# Patient Record
Sex: Male | Born: 1935 | Race: White | Hispanic: No | Smoking: Former smoker
Health system: Southern US, Community
[De-identification: ages and names within clinical notes are randomized; demographics above are authoritative.]

## PROBLEM LIST (undated history)

## (undated) DIAGNOSIS — Z8619 Personal history of other infectious and parasitic diseases: Secondary | ICD-10-CM

## (undated) DIAGNOSIS — I4510 Unspecified right bundle-branch block: Secondary | ICD-10-CM

## (undated) DIAGNOSIS — I480 Paroxysmal atrial fibrillation: Secondary | ICD-10-CM

## (undated) DIAGNOSIS — Z8551 Personal history of malignant neoplasm of bladder: Secondary | ICD-10-CM

## (undated) DIAGNOSIS — R319 Hematuria, unspecified: Secondary | ICD-10-CM

## (undated) DIAGNOSIS — I2510 Atherosclerotic heart disease of native coronary artery without angina pectoris: Secondary | ICD-10-CM

## (undated) DIAGNOSIS — K759 Inflammatory liver disease, unspecified: Secondary | ICD-10-CM

## (undated) DIAGNOSIS — K5790 Diverticulosis of intestine, part unspecified, without perforation or abscess without bleeding: Secondary | ICD-10-CM

## (undated) DIAGNOSIS — Z8739 Personal history of other diseases of the musculoskeletal system and connective tissue: Secondary | ICD-10-CM

## (undated) DIAGNOSIS — R32 Unspecified urinary incontinence: Secondary | ICD-10-CM

## (undated) DIAGNOSIS — C61 Malignant neoplasm of prostate: Secondary | ICD-10-CM

## (undated) DIAGNOSIS — H04123 Dry eye syndrome of bilateral lacrimal glands: Secondary | ICD-10-CM

## (undated) DIAGNOSIS — M19019 Primary osteoarthritis, unspecified shoulder: Secondary | ICD-10-CM

## (undated) DIAGNOSIS — I498 Other specified cardiac arrhythmias: Secondary | ICD-10-CM

## (undated) DIAGNOSIS — R011 Cardiac murmur, unspecified: Secondary | ICD-10-CM

## (undated) DIAGNOSIS — Z7901 Long term (current) use of anticoagulants: Secondary | ICD-10-CM

## (undated) DIAGNOSIS — J45909 Unspecified asthma, uncomplicated: Secondary | ICD-10-CM

## (undated) DIAGNOSIS — R350 Frequency of micturition: Secondary | ICD-10-CM

## (undated) DIAGNOSIS — R351 Nocturia: Secondary | ICD-10-CM

## (undated) DIAGNOSIS — I10 Essential (primary) hypertension: Secondary | ICD-10-CM

## (undated) DIAGNOSIS — M1990 Unspecified osteoarthritis, unspecified site: Secondary | ICD-10-CM

## (undated) DIAGNOSIS — Z87438 Personal history of other diseases of male genital organs: Secondary | ICD-10-CM

## (undated) DIAGNOSIS — R634 Abnormal weight loss: Secondary | ICD-10-CM

## (undated) HISTORY — PX: TRANSURETHRAL RESECTION OF PROSTATE: SHX73

## (undated) HISTORY — PX: CARDIOVASCULAR STRESS TEST: SHX262

## (undated) HISTORY — PX: TONSILLECTOMY: SUR1361

## (undated) HISTORY — DX: Other specified cardiac arrhythmias: I49.8

## (undated) HISTORY — DX: Primary osteoarthritis, unspecified shoulder: M19.019

## (undated) HISTORY — PX: CARDIOVERSION: SHX1299

## (undated) HISTORY — PX: CATARACT EXTRACTION W/ INTRAOCULAR LENS  IMPLANT, BILATERAL: SHX1307

---

## 1963-11-19 DIAGNOSIS — Z8619 Personal history of other infectious and parasitic diseases: Secondary | ICD-10-CM

## 1963-11-19 HISTORY — DX: Personal history of other infectious and parasitic diseases: Z86.19

## 2000-06-19 ENCOUNTER — Ambulatory Visit (HOSPITAL_COMMUNITY): Admission: RE | Admit: 2000-06-19 | Discharge: 2000-06-19 | Payer: Self-pay | Admitting: Specialist

## 2000-06-19 ENCOUNTER — Encounter: Payer: Self-pay | Admitting: Specialist

## 2000-08-05 ENCOUNTER — Inpatient Hospital Stay (HOSPITAL_COMMUNITY): Admission: RE | Admit: 2000-08-05 | Discharge: 2000-08-07 | Payer: Self-pay | Admitting: *Deleted

## 2000-08-26 ENCOUNTER — Inpatient Hospital Stay (HOSPITAL_COMMUNITY): Admission: AD | Admit: 2000-08-26 | Discharge: 2000-08-29 | Payer: Self-pay | Admitting: *Deleted

## 2000-09-02 ENCOUNTER — Ambulatory Visit (HOSPITAL_COMMUNITY): Admission: RE | Admit: 2000-09-02 | Discharge: 2000-09-02 | Payer: Self-pay | Admitting: *Deleted

## 2000-09-02 ENCOUNTER — Encounter: Payer: Self-pay | Admitting: *Deleted

## 2000-12-24 ENCOUNTER — Ambulatory Visit (HOSPITAL_COMMUNITY): Admission: RE | Admit: 2000-12-24 | Discharge: 2000-12-24 | Payer: Self-pay | Admitting: Specialist

## 2001-01-13 ENCOUNTER — Ambulatory Visit: Admission: RE | Admit: 2001-01-13 | Discharge: 2001-01-13 | Payer: Self-pay | Admitting: *Deleted

## 2008-11-08 HISTORY — PX: TRANSTHORACIC ECHOCARDIOGRAM: SHX275

## 2011-09-25 ENCOUNTER — Emergency Department (HOSPITAL_COMMUNITY): Payer: Medicare Other

## 2011-09-25 ENCOUNTER — Other Ambulatory Visit: Payer: Self-pay

## 2011-09-25 ENCOUNTER — Encounter: Payer: Self-pay | Admitting: Emergency Medicine

## 2011-09-25 ENCOUNTER — Observation Stay (HOSPITAL_COMMUNITY)
Admission: EM | Admit: 2011-09-25 | Discharge: 2011-09-26 | Disposition: A | Discharge status: Home / Self Care | Payer: Medicare Other | Source: Ambulatory Visit | Attending: General Surgery | Admitting: General Surgery

## 2011-09-25 ENCOUNTER — Encounter (INDEPENDENT_AMBULATORY_CARE_PROVIDER_SITE_OTHER): Payer: Self-pay | Admitting: General Surgery

## 2011-09-25 DIAGNOSIS — S0083XA Contusion of other part of head, initial encounter: Secondary | ICD-10-CM | POA: Diagnosis present

## 2011-09-25 DIAGNOSIS — T148XXA Other injury of unspecified body region, initial encounter: Secondary | ICD-10-CM

## 2011-09-25 DIAGNOSIS — S0003XA Contusion of scalp, initial encounter: Principal | ICD-10-CM | POA: Insufficient documentation

## 2011-09-25 DIAGNOSIS — M4802 Spinal stenosis, cervical region: Secondary | ICD-10-CM | POA: Insufficient documentation

## 2011-09-25 DIAGNOSIS — IMO0002 Reserved for concepts with insufficient information to code with codable children: Secondary | ICD-10-CM | POA: Diagnosis present

## 2011-09-25 DIAGNOSIS — M109 Gout, unspecified: Secondary | ICD-10-CM | POA: Insufficient documentation

## 2011-09-25 DIAGNOSIS — S60229A Contusion of unspecified hand, initial encounter: Secondary | ICD-10-CM | POA: Insufficient documentation

## 2011-09-25 DIAGNOSIS — Z7901 Long term (current) use of anticoagulants: Secondary | ICD-10-CM | POA: Insufficient documentation

## 2011-09-25 DIAGNOSIS — I10 Essential (primary) hypertension: Secondary | ICD-10-CM | POA: Insufficient documentation

## 2011-09-25 DIAGNOSIS — S5010XA Contusion of unspecified forearm, initial encounter: Secondary | ICD-10-CM | POA: Insufficient documentation

## 2011-09-25 DIAGNOSIS — S1093XA Contusion of unspecified part of neck, initial encounter: Secondary | ICD-10-CM

## 2011-09-25 DIAGNOSIS — J40 Bronchitis, not specified as acute or chronic: Secondary | ICD-10-CM | POA: Insufficient documentation

## 2011-09-25 DIAGNOSIS — Y998 Other external cause status: Secondary | ICD-10-CM | POA: Insufficient documentation

## 2011-09-25 DIAGNOSIS — T07XXXA Unspecified multiple injuries, initial encounter: Secondary | ICD-10-CM

## 2011-09-25 DIAGNOSIS — I4891 Unspecified atrial fibrillation: Secondary | ICD-10-CM | POA: Insufficient documentation

## 2011-09-25 HISTORY — DX: Essential (primary) hypertension: I10

## 2011-09-25 HISTORY — DX: Personal history of other diseases of male genital organs: Z87.438

## 2011-09-25 LAB — PROTIME-INR
INR: 2.44 — ABNORMAL HIGH (ref 0.00–1.49)
Prothrombin Time: 26.9 seconds — ABNORMAL HIGH (ref 11.6–15.2)

## 2011-09-25 LAB — BASIC METABOLIC PANEL
BUN: 19 mg/dL (ref 6–23)
Chloride: 106 mEq/L (ref 96–112)
GFR calc Af Amer: 69 mL/min — ABNORMAL LOW (ref >90)
Glucose, Bld: 87 mg/dL (ref 70–99)
Potassium: 4 mEq/L (ref 3.5–5.1)
Sodium: 140 mEq/L (ref 135–145)

## 2011-09-25 LAB — DIFFERENTIAL
Basophils Relative: 0 % (ref 0–1)
Lymphs Abs: 2.9 10*3/uL (ref 0.7–4.0)
Monocytes Relative: 7 % (ref 3–12)
Neutro Abs: 4.1 10*3/uL (ref 1.7–7.7)
Neutrophils Relative %: 53 % (ref 43–77)

## 2011-09-25 LAB — CBC
Hemoglobin: 14.2 g/dL (ref 13.0–17.0)
RBC: 4.62 MIL/uL (ref 4.22–5.81)
WBC: 7.7 10*3/uL (ref 4.0–10.5)

## 2011-09-25 MED ORDER — POTASSIUM CHLORIDE 2 MEQ/ML IV SOLN
INTRAVENOUS | Status: DC
  Administered 2011-09-26: 02:00:00 via INTRAVENOUS
  Filled 2011-09-25: qty 1000

## 2011-09-25 MED ORDER — MOXIFLOXACIN HCL 400 MG PO TABS
400.0000 mg | ORAL_TABLET | Freq: Every day | ORAL | Status: DC
  Administered 2011-09-25: 400 mg via ORAL
  Filled 2011-09-25 (×2): qty 1

## 2011-09-25 MED ORDER — TETANUS-DIPHTH-ACELL PERTUSSIS 5-2.5-18.5 LF-MCG/0.5 IM SUSP
0.5000 mL | Freq: Once | INTRAMUSCULAR | Status: AC
  Administered 2011-09-25: 0.5 mL via INTRAMUSCULAR
  Filled 2011-09-25: qty 0.5

## 2011-09-25 MED ORDER — TETANUS-DIPHTHERIA TOXOIDS TD 5-2 LFU IM INJ
0.5000 mL | INJECTION | Freq: Once | INTRAMUSCULAR | Status: DC
  Filled 2011-09-25: qty 0.5

## 2011-09-25 NOTE — ED Notes (Signed)
C Collar removed by MD after studies.
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
## 2011-09-25 NOTE — ED Notes (Signed)
VIS - Vaccine information statement date is 12/11/2010

## 2011-09-26 ENCOUNTER — Encounter (HOSPITAL_COMMUNITY): Payer: Self-pay | Admitting: *Deleted

## 2011-09-26 DIAGNOSIS — N200 Calculus of kidney: Secondary | ICD-10-CM | POA: Insufficient documentation

## 2011-09-26 DIAGNOSIS — J209 Acute bronchitis, unspecified: Secondary | ICD-10-CM | POA: Insufficient documentation

## 2011-09-26 DIAGNOSIS — I10 Essential (primary) hypertension: Secondary | ICD-10-CM | POA: Insufficient documentation

## 2011-09-26 DIAGNOSIS — IMO0002 Reserved for concepts with insufficient information to code with codable children: Secondary | ICD-10-CM | POA: Diagnosis present

## 2011-09-26 DIAGNOSIS — S5010XA Contusion of unspecified forearm, initial encounter: Secondary | ICD-10-CM | POA: Diagnosis present

## 2011-09-26 DIAGNOSIS — M109 Gout, unspecified: Secondary | ICD-10-CM | POA: Insufficient documentation

## 2011-09-26 DIAGNOSIS — I4891 Unspecified atrial fibrillation: Secondary | ICD-10-CM | POA: Insufficient documentation

## 2011-09-26 LAB — BASIC METABOLIC PANEL
BUN: 17 mg/dL (ref 6–23)
CO2: 24 mEq/L (ref 19–32)
GFR calc non Af Amer: 61 mL/min — ABNORMAL LOW (ref >90)
Glucose, Bld: 96 mg/dL (ref 70–99)
Potassium: 4.1 mEq/L (ref 3.5–5.1)

## 2011-09-26 LAB — CBC
HCT: 39.1 % (ref 39.0–52.0)
Hemoglobin: 13.2 g/dL (ref 13.0–17.0)
MCHC: 33.8 g/dL (ref 30.0–36.0)
RBC: 4.34 MIL/uL (ref 4.22–5.81)

## 2011-09-26 LAB — PROTIME-INR: INR: 2.59 — ABNORMAL HIGH (ref 0.00–1.49)

## 2011-09-26 MED ORDER — ACETAMINOPHEN 325 MG PO TABS: 325.0000 mg | ORAL_TABLET | Freq: Four times a day (QID) | ORAL | Status: AC | PRN

## 2011-09-26 MED ORDER — MOXIFLOXACIN HCL 400 MG PO TABS: 400.0000 mg | ORAL_TABLET | Freq: Every day | ORAL | Status: AC

## 2011-09-26 MED ORDER — BACITRACIN-NEOMYCIN-POLYMYXIN 400-5-5000 EX OINT
TOPICAL_OINTMENT | CUTANEOUS | Status: AC
  Filled 2011-09-26: qty 1
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
## 2011-11-13 ENCOUNTER — Other Ambulatory Visit: Payer: Self-pay | Admitting: Urology

## 2011-12-03 ENCOUNTER — Other Ambulatory Visit: Payer: Self-pay | Admitting: Urology

## 2011-12-04 MED ORDER — ACETAMINOPHEN 10 MG/ML IV SOLN: 1000.0000 mg | Freq: Four times a day (QID) | INTRAVENOUS | Status: AC

## 2011-12-09 ENCOUNTER — Encounter (HOSPITAL_BASED_OUTPATIENT_CLINIC_OR_DEPARTMENT_OTHER): Payer: Self-pay | Admitting: *Deleted

## 2011-12-09 NOTE — Progress Notes (Addendum)
NPO AFTER MN. ARRIVES AT 0630. NEEDS HG, BMET, PT/PTT. EKG, OFFICE NOTE, STRESS TEST AND ECHO TO BE FAXED FROM DR Anne Fu.  WILL TAKE CLONIDINE AM OF SURG. W/ SIP OF WATER.  RECEIVED INFO FROM DR Anne Fu. CURRENT EKG W/ CHART

## 2011-12-12 ENCOUNTER — Encounter (HOSPITAL_BASED_OUTPATIENT_CLINIC_OR_DEPARTMENT_OTHER): Payer: Self-pay | Admitting: *Deleted

## 2011-12-16 ENCOUNTER — Encounter (HOSPITAL_BASED_OUTPATIENT_CLINIC_OR_DEPARTMENT_OTHER): Payer: Self-pay | Admitting: Anesthesiology

## 2011-12-16 ENCOUNTER — Encounter (HOSPITAL_BASED_OUTPATIENT_CLINIC_OR_DEPARTMENT_OTHER): Payer: Self-pay | Admitting: *Deleted

## 2011-12-16 ENCOUNTER — Encounter (HOSPITAL_BASED_OUTPATIENT_CLINIC_OR_DEPARTMENT_OTHER)
Admission: RE | Disposition: A | Discharge status: Home / Self Care | Payer: Self-pay | Source: Ambulatory Visit | Attending: Urology

## 2011-12-16 ENCOUNTER — Ambulatory Visit (HOSPITAL_BASED_OUTPATIENT_CLINIC_OR_DEPARTMENT_OTHER)
Admission: RE | Admit: 2011-12-16 | Discharge: 2011-12-16 | Disposition: A | Discharge status: Home / Self Care | Payer: Medicare Other | Source: Ambulatory Visit | Attending: Urology | Admitting: Urology

## 2011-12-16 DIAGNOSIS — I10 Essential (primary) hypertension: Secondary | ICD-10-CM | POA: Insufficient documentation

## 2011-12-16 DIAGNOSIS — I4891 Unspecified atrial fibrillation: Secondary | ICD-10-CM | POA: Insufficient documentation

## 2011-12-16 DIAGNOSIS — I252 Old myocardial infarction: Secondary | ICD-10-CM | POA: Insufficient documentation

## 2011-12-16 DIAGNOSIS — R319 Hematuria, unspecified: Secondary | ICD-10-CM | POA: Insufficient documentation

## 2011-12-16 DIAGNOSIS — N490 Inflammatory disorders of seminal vesicle: Secondary | ICD-10-CM | POA: Insufficient documentation

## 2011-12-16 DIAGNOSIS — N529 Male erectile dysfunction, unspecified: Secondary | ICD-10-CM | POA: Insufficient documentation

## 2011-12-16 DIAGNOSIS — C61 Malignant neoplasm of prostate: Secondary | ICD-10-CM | POA: Insufficient documentation

## 2011-12-16 DIAGNOSIS — K219 Gastro-esophageal reflux disease without esophagitis: Secondary | ICD-10-CM | POA: Insufficient documentation

## 2011-12-16 DIAGNOSIS — Z7901 Long term (current) use of anticoagulants: Secondary | ICD-10-CM | POA: Insufficient documentation

## 2011-12-16 DIAGNOSIS — Z79899 Other long term (current) drug therapy: Secondary | ICD-10-CM | POA: Insufficient documentation

## 2011-12-16 HISTORY — DX: Malignant neoplasm of prostate: C61

## 2011-12-16 HISTORY — DX: Unspecified osteoarthritis, unspecified site: M19.90

## 2011-12-16 HISTORY — DX: Nocturia: R35.1

## 2011-12-16 HISTORY — DX: Long term (current) use of anticoagulants: Z79.01

## 2011-12-16 HISTORY — DX: Unspecified right bundle-branch block: I45.10

## 2011-12-16 HISTORY — DX: Atherosclerotic heart disease of native coronary artery without angina pectoris: I25.10

## 2011-12-16 HISTORY — DX: Personal history of other diseases of the musculoskeletal system and connective tissue: Z87.39

## 2011-12-16 HISTORY — DX: Paroxysmal atrial fibrillation: I48.0

## 2011-12-16 HISTORY — DX: Frequency of micturition: R35.0

## 2011-12-16 LAB — BASIC METABOLIC PANEL
BUN: 21 mg/dL (ref 6–23)
Calcium: 9.8 mg/dL (ref 8.4–10.5)
GFR calc Af Amer: 74 mL/min — ABNORMAL LOW (ref >90)
GFR calc non Af Amer: 63 mL/min — ABNORMAL LOW (ref >90)
Potassium: 3.7 mEq/L (ref 3.5–5.1)
Sodium: 142 mEq/L (ref 135–145)

## 2011-12-16 LAB — PROTIME-INR
INR: 1.2 (ref 0.00–1.49)
Prothrombin Time: 15.5 seconds — ABNORMAL HIGH (ref 11.6–15.2)

## 2011-12-16 LAB — HEMOGLOBIN AND HEMATOCRIT, BLOOD: HCT: 43 % (ref 39.0–52.0)

## 2011-12-16 SURGERY — CRYOABLATION, PROSTATE
Anesthesia: General

## 2011-12-16 MED ORDER — CEFAZOLIN SODIUM 1-5 GM-% IV SOLN: 1.0000 g | INTRAVENOUS | Status: DC

## 2011-12-16 MED ORDER — LACTATED RINGERS IV SOLN
INTRAVENOUS | Status: DC
  Administered 2011-12-16: 100 mL/h via INTRAVENOUS
  Administered 2011-12-16: 07:00:00 via INTRAVENOUS

## 2011-12-16 SURGICAL SUPPLY — 31 items
BAG DRN ANRFLXCHMBR STRAP LEK (BAG)
BAG URINE DRAINAGE (UROLOGICAL SUPPLIES) IMPLANT
BAG URINE LEG 19OZ MD ST LTX (BAG) IMPLANT
BANDAGE CONFORM 3  STR LF (GAUZE/BANDAGES/DRESSINGS) IMPLANT
BLADE SURG ROTATE 9660 (MISCELLANEOUS) ×1 IMPLANT
BOOTIES KNEE HIGH SLOAN (MISCELLANEOUS) IMPLANT
CANISTER SUCTION 2500CC (MISCELLANEOUS) IMPLANT
CATH FOLEY 2WAY SLVR  5CC 18FR (CATHETERS)
CATH FOLEY 2WAY SLVR 5CC 18FR (CATHETERS) ×1 IMPLANT
CHARGE TECH PROCEDURE ONCURA (LABOR (TRAVEL & OVERTIME)) ×1 IMPLANT
CLOTH BEACON ORANGE TIMEOUT ST (SAFETY) ×1 IMPLANT
COVER MAYO STAND STRL (DRAPES) IMPLANT
DRAPE CAMERA CLOSED 9X96 (DRAPES) IMPLANT
DRAPE INCISE IOBAN 66X45 STRL (DRAPES) ×1 IMPLANT
DRAPE UNDERBUTTOCKS STRL (DRAPE) IMPLANT
DRSG TEGADERM 4X4.75 (GAUZE/BANDAGES/DRESSINGS) ×1 IMPLANT
DRSG TEGADERM 8X12 (GAUZE/BANDAGES/DRESSINGS) ×1 IMPLANT
GAS ARGON HIGH PRESSURE (MEDICAL GASES) ×1 IMPLANT
GLOVE BIO SURGEON STRL SZ7.5 (GLOVE) ×2 IMPLANT
GUIDEWIRE SUPER STIFF (WIRE) IMPLANT
HOLDER FOLEY CATH W/STRAP (MISCELLANEOUS) ×1 IMPLANT
KIT PROSTATE PRESICE I (KITS) IMPLANT
NDL SPNL 18GX3.5 QUINCKE PK (NEEDLE) IMPLANT
NEEDLE SPNL 18GX3.5 QUINCKE PK (NEEDLE) IMPLANT
PACK CYSTOSCOPY (CUSTOM PROCEDURE TRAY) ×1 IMPLANT
PLUG CATH AND CAP STER (CATHETERS) ×1 IMPLANT
SPONGE GAUZE 4X4 12PLY (GAUZE/BANDAGES/DRESSINGS) IMPLANT
SYRINGE 10CC LL (SYRINGE) ×1 IMPLANT
SYRINGE IRR TOOMEY STRL 70CC (SYRINGE) IMPLANT
UNDERPAD 30X30 INCONTINENT (UNDERPADS AND DIAPERS) ×1 IMPLANT
WATER STERILE IRR 500ML POUR (IV SOLUTION) IMPLANT

## 2011-12-16 NOTE — H&P (Signed)
Chief Complaint  cc: Dr. Catha Gosselin, [place] @  [facility].   Reason For Visit  F/u to discss CTT   Active Problems Problems  1. Benign Localized Prostatic Hyperplasia With Urinary Obstruction 600.21 2. Hematuria 599.70 3. 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 Prostate Cancer 185 5. Prostate Hard Area Or Nodule Bilaterally 6. Seminal Vesiculitis 608.0  History of Present Illness         78 yo male returns today as pre-op for Cryotherapy.  Hx of prostate cancer on watchful waiting protocol.  Hx of prostate nodule, brown urinary drainage and longstanding issue with urgency,  weak stream and nocturia x 3.  He is taking Diazepam 5mg  to help relax his sphincter.  06/05/11  PSA - 1.80.  He was originally referred by Dr. Clarene Duke for ED & retrograde ejaculation. Married for 60+ yrs. However, 5 yrs ago, wife lost interest in sexual activity, and pt had lost erections good enough to penetrate. He gets intense urges for sexual activity, but then noted dark brown "toothpaste" ejaculate. He took sample to Dr. Fredirick Maudlin office, and dx of possible infection, but no antibiotics given.  He tried Viagra with partial erection.  He has sexual drive, but cannot masturbate.  He has slow urinary stream, frequency, and urgency, with IPSS=17.   Past Medical History Problems  1. History of  Acute Myocardial Infarction V12.59 2. History of  Asthma 493.90 3. History of  Atrial Fibrillation 427.31 4. History of  Esophageal Reflux 530.81 5. History of  Gout 274.9 6. History of  Hepatitis 573.3 7. History of  Hypertension 401.9  Surgical History Problems  1. History of  No Surgical Problems  Current Meds 1. Allopurinol 300 MG Oral Tablet; Therapy: (Recorded:18Jul2012) to 2. Aspirin 81 MG Oral Tablet; Therapy: (Recorded:18Jul2012) to 3. CloNIDine HCl 0.1 MG Oral Tablet; Therapy: (Recorded:05Dec2012) to 4. Diazepam 5 MG Oral Tablet; Therapy: (Recorded:05Dec2012) to 5. ICaps CAPS; Therapy:  (Recorded:13Aug2012) to 6. Lisinopril 40 MG Oral Tablet; Therapy: (Recorded:18Jul2012) to 7. Losartan Potassium 25 MG Oral Tablet; Therapy: (Recorded:05Dec2012) to 8. Warfarin Sodium 2 MG Oral Tablet; Therapy: (Recorded:18Jul2012) to  Allergies Medication  1. No Known Drug Allergies  Family History Problems  1. Family history of  Family Health Status Number Of Children 2 sons 2. Family history of  Father Deceased At Age 42 stroke 3. Family history of  Mother Deceased At Age 35 natural causes 4. Maternal history of  Transient Ischemic Attack 5. Paternal history of  Transient Ischemic Attack  Social History Problems  1. Alcohol Use 4 per day 2. Caffeine Use 1 per day 3. Former Smoker V15.82 quit 75yrs ago 4. Marital History - Currently Married 5. Occupation: Retired  Event organiser, constitutional, skin, eye, otolaryngeal, hematologic/lymphatic, cardiovascular, pulmonary, endocrine, musculoskeletal, gastrointestinal, neurological and psychiatric system(s) were reviewed and pertinent findings if present are noted.  Genitourinary: urinary frequency, feelings of urinary urgency, nocturia, incontinence, difficulty starting the urinary stream, weak urinary stream, urinary stream starts and stops, incomplete emptying of bladder, post-void dribbling and erectile dysfunction, but no dysuria.    Vitals Vital Signs [Data Includes: Last 1 Day]  05Dec2012 03:33PM  Blood Pressure: 144 / 62 Temperature: 97.7 F Heart Rate: 47  Physical Exam Rectal: Rectal exam demonstrates decreased sphincter tone. Estimated prostate size is 4+. The prostate is indurated involving the entire left lobe of the prostate which appears to be confined within the prostate capsule, is not tender and is not fluctuant. The left seminal vesicle is  nonpalpable. The right seminal vesicle is nonpalpable.    Results/Data Urine [Data Includes: Last 1 Day]   05Dec2012  COLOR YELLOW   APPEARANCE CLEAR    SPECIFIC GRAVITY 1.015   pH 5.0   GLUCOSE NEG mg/dL  BILIRUBIN NEG   KETONE TRACE mg/dL  BLOOD LARGE   PROTEIN NEG mg/dL  UROBILINOGEN 0.2 mg/dL  NITRITE NEG   LEUKOCYTE ESTERASE NEG   SQUAMOUS EPITHELIAL/HPF RARE   WBC 0-3 WBC/hpf  RBC 11-20 RBC/hpf  BACTERIA NONE SEEN   CRYSTALS NONE SEEN   CASTS NONE SEEN   Selected Results  PSA REFLEX TO FREE 18Jul2012 11:33AM Jethro Bolus  SPECIMEN TYPE: BLOOD   Test Name Result Flag Reference  PSA 1.80 ng/mL  <=4.00  Test Methodology: Hybritech PSA   Assessment Assessed  1. Benign Localized Prostatic Hyperplasia With Urinary Obstruction 600.21 2. Prostate Cancer 185 3. Prostate Hard Area Or Nodule Bilaterally      78 yo male with 60.43cc gland, IPSS=17, G 3+3 ca in 2 areas on biopsy, psa 1.8, rectal exam with L side induration. He has multifocal Ca, and significant bladder outlet obstruction. He has A Fib on coumadin.    He has been taking diazepam. OK to keep taking.   Plan  Plan for cryotherapy.   Signatures Electronically signed by : Jethro Bolus, M.D.; Oct 23 2011  4:00PM

## 2011-12-16 NOTE — Progress Notes (Signed)
Patient dipping smokeless tobacco this am. States he last had tobacco at 0700. Dr. Marcello Fennel made aware and elect to cancel surgery and reschedule at later date. Following my conversation with Dr. Patsi Sears, I returned to room to find patient removing additional tobacco from mouth. A Chasady Longwell MD

## 2011-12-16 NOTE — Anesthesia Preprocedure Evaluation (Signed)
Anesthesia Evaluation    Airway       Dental   Pulmonary          Cardiovascular hypertension, - angina+ CAD + dysrhythmias Atrial Fibrillation     Neuro/Psych    GI/Hepatic   Endo/Other    Renal/GU      Musculoskeletal   Abdominal   Peds  Hematology   Anesthesia Other Findings   Reproductive/Obstetrics                           Anesthesia Physical Anesthesia Plan Anesthesia Quick Evaluation

## 2012-01-20 ENCOUNTER — Encounter (HOSPITAL_BASED_OUTPATIENT_CLINIC_OR_DEPARTMENT_OTHER): Payer: Self-pay | Admitting: *Deleted

## 2012-01-20 ENCOUNTER — Other Ambulatory Visit: Payer: Self-pay | Admitting: Urology

## 2012-01-20 NOTE — Progress Notes (Signed)
NPO AFTER MN. ARRIVES AT 0730. NEEDS ISTAT AND PT/INR . PT VERBALIZED UNDERSTANDING OF NOTHING BY MOUTH , INCLUDING NO SNUFF.

## 2012-01-24 ENCOUNTER — Encounter (HOSPITAL_BASED_OUTPATIENT_CLINIC_OR_DEPARTMENT_OTHER): Payer: Self-pay | Admitting: Anesthesiology

## 2012-01-24 ENCOUNTER — Ambulatory Visit (HOSPITAL_BASED_OUTPATIENT_CLINIC_OR_DEPARTMENT_OTHER): Payer: Medicare Other | Admitting: Anesthesiology

## 2012-01-24 ENCOUNTER — Other Ambulatory Visit: Payer: Self-pay

## 2012-01-24 ENCOUNTER — Ambulatory Visit (HOSPITAL_BASED_OUTPATIENT_CLINIC_OR_DEPARTMENT_OTHER)
Admission: RE | Admit: 2012-01-24 | Discharge: 2012-01-24 | Disposition: A | Discharge status: Home Health | Payer: Medicare Other | Source: Ambulatory Visit | Attending: Urology | Admitting: Urology

## 2012-01-24 ENCOUNTER — Encounter (HOSPITAL_BASED_OUTPATIENT_CLINIC_OR_DEPARTMENT_OTHER): Payer: Self-pay | Admitting: *Deleted

## 2012-01-24 ENCOUNTER — Encounter (HOSPITAL_BASED_OUTPATIENT_CLINIC_OR_DEPARTMENT_OTHER)
Admission: RE | Disposition: A | Discharge status: Home Health | Payer: Self-pay | Source: Ambulatory Visit | Attending: Urology

## 2012-01-24 DIAGNOSIS — Z7982 Long term (current) use of aspirin: Secondary | ICD-10-CM | POA: Insufficient documentation

## 2012-01-24 DIAGNOSIS — Z4801 Encounter for change or removal of surgical wound dressing: Secondary | ICD-10-CM | POA: Insufficient documentation

## 2012-01-24 DIAGNOSIS — C61 Malignant neoplasm of prostate: Secondary | ICD-10-CM | POA: Insufficient documentation

## 2012-01-24 DIAGNOSIS — Z79899 Other long term (current) drug therapy: Secondary | ICD-10-CM | POA: Insufficient documentation

## 2012-01-24 DIAGNOSIS — I2510 Atherosclerotic heart disease of native coronary artery without angina pectoris: Secondary | ICD-10-CM | POA: Insufficient documentation

## 2012-01-24 DIAGNOSIS — I4891 Unspecified atrial fibrillation: Secondary | ICD-10-CM | POA: Insufficient documentation

## 2012-01-24 DIAGNOSIS — I10 Essential (primary) hypertension: Secondary | ICD-10-CM | POA: Insufficient documentation

## 2012-01-24 HISTORY — PX: CRYOABLATION: SHX1415

## 2012-01-24 LAB — POCT I-STAT 4, (NA,K, GLUC, HGB,HCT)
HCT: 46 % (ref 39.0–52.0)
Hemoglobin: 15.6 g/dL (ref 13.0–17.0)
Potassium: 3.6 mEq/L (ref 3.5–5.1)
Sodium: 137 mEq/L (ref 135–145)

## 2012-01-24 LAB — PROTIME-INR: Prothrombin Time: 13.8 seconds (ref 11.6–15.2)

## 2012-01-24 SURGERY — CRYOABLATION, PROSTATE
Anesthesia: General | Site: Prostate | Wound class: Clean Contaminated

## 2012-01-24 MED ORDER — WARFARIN SODIUM 2 MG PO TABS: 3.0000 mg | ORAL_TABLET | Freq: Every day | ORAL | Status: DC

## 2012-01-24 MED ORDER — LACTATED RINGERS IV SOLN
INTRAVENOUS | Status: DC
  Administered 2012-01-24 (×2): via INTRAVENOUS

## 2012-01-24 MED ORDER — ETOMIDATE 2 MG/ML IV SOLN
INTRAVENOUS | Status: DC | PRN
  Administered 2012-01-24: 40 mg via INTRAVENOUS

## 2012-01-24 MED ORDER — LACTATED RINGERS IV SOLN
INTRAVENOUS | Status: DC
  Administered 2012-01-24: 15:00:00 via INTRAVENOUS

## 2012-01-24 MED ORDER — HYDROCODONE-ACETAMINOPHEN 7.5-650 MG PO TABS: 1.0000 | ORAL_TABLET | Freq: Four times a day (QID) | ORAL | Status: AC | PRN

## 2012-01-24 MED ORDER — CEFAZOLIN SODIUM 1-5 GM-% IV SOLN
1.0000 g | INTRAVENOUS | Status: AC
  Administered 2012-01-24: 1 g via INTRAVENOUS

## 2012-01-24 MED ORDER — STERILE WATER FOR IRRIGATION IR SOLN
Status: DC | PRN
  Administered 2012-01-24: 3000 mL

## 2012-01-24 MED ORDER — BELLADONNA ALKALOIDS-OPIUM 16.2-60 MG RE SUPP
RECTAL | Status: DC | PRN
  Administered 2012-01-24: 1 via RECTAL

## 2012-01-24 MED ORDER — HYDROCODONE-ACETAMINOPHEN 7.5-325 MG PO TABS
1.0000 | ORAL_TABLET | Freq: Four times a day (QID) | ORAL | Status: DC | PRN
  Administered 2012-01-24: 1 via ORAL

## 2012-01-24 MED ORDER — DEXAMETHASONE SODIUM PHOSPHATE 4 MG/ML IJ SOLN
INTRAMUSCULAR | Status: DC | PRN
  Administered 2012-01-24: 4 mg via INTRAVENOUS

## 2012-01-24 MED ORDER — ONDANSETRON HCL 4 MG/2ML IJ SOLN
INTRAMUSCULAR | Status: DC | PRN
  Administered 2012-01-24: 4 mg via INTRAVENOUS

## 2012-01-24 MED ORDER — FENTANYL CITRATE 0.05 MG/ML IJ SOLN: 25.0000 ug | INTRAMUSCULAR | Status: DC | PRN

## 2012-01-24 MED ORDER — LIDOCAINE HCL (CARDIAC) 20 MG/ML IV SOLN
INTRAVENOUS | Status: DC | PRN
  Administered 2012-01-24: 40 mg via INTRAVENOUS

## 2012-01-24 MED ORDER — FENTANYL CITRATE 0.05 MG/ML IJ SOLN
INTRAMUSCULAR | Status: DC | PRN
  Administered 2012-01-24: 25 ug via INTRAVENOUS
  Administered 2012-01-24 (×2): 50 ug via INTRAVENOUS
  Administered 2012-01-24: 25 ug via INTRAVENOUS

## 2012-01-24 MED ORDER — PROPOFOL 10 MG/ML IV EMUL
INTRAVENOUS | Status: DC | PRN
  Administered 2012-01-24: 20 mg via INTRAVENOUS
  Administered 2012-01-24 (×2): 30 mg via INTRAVENOUS

## 2012-01-24 MED ORDER — KETOROLAC TROMETHAMINE 30 MG/ML IJ SOLN
INTRAMUSCULAR | Status: DC | PRN
  Administered 2012-01-24: 15 mg via INTRAVENOUS

## 2012-01-24 MED ORDER — TRIMETHOPRIM 100 MG PO TABS: 100.0000 mg | ORAL_TABLET | ORAL | Status: AC

## 2012-01-24 SURGICAL SUPPLY — 38 items
BAG DRN ANRFLXCHMBR STRAP LEK (BAG)
BAG URINE DRAINAGE (UROLOGICAL SUPPLIES) ×2 IMPLANT
BAG URINE LEG 19OZ MD ST LTX (BAG) IMPLANT
BANDAGE CONFORM 3  STR LF (GAUZE/BANDAGES/DRESSINGS) IMPLANT
BLADE SURG ROTATE 9660 (MISCELLANEOUS) ×2 IMPLANT
BOOTIES KNEE HIGH SLOAN (MISCELLANEOUS) ×2 IMPLANT
CANISTER SUCTION 2500CC (MISCELLANEOUS) IMPLANT
CATH FOLEY 2WAY SLVR  5CC 18FR (CATHETERS) ×1
CATH FOLEY 2WAY SLVR 5CC 18FR (CATHETERS) ×1 IMPLANT
CHARGE TECH PROCEDURE ONCURA (LABOR (TRAVEL & OVERTIME)) ×2 IMPLANT
CLOTH BEACON ORANGE TIMEOUT ST (SAFETY) ×2 IMPLANT
COVER MAYO STAND STRL (DRAPES) ×2 IMPLANT
CRYO KIT ×2 IMPLANT
DRAPE CAMERA CLOSED 9X96 (DRAPES) ×2 IMPLANT
DRAPE INCISE IOBAN 66X45 STRL (DRAPES) ×2 IMPLANT
DRAPE UNDERBUTTOCKS STRL (DRAPE) ×2 IMPLANT
DRSG TEGADERM 4X4.75 (GAUZE/BANDAGES/DRESSINGS) ×2 IMPLANT
DRSG TEGADERM 8X12 (GAUZE/BANDAGES/DRESSINGS) ×2 IMPLANT
GAS ARGON HIGH PRESSURE (MEDICAL GASES) ×2 IMPLANT
GAUZE SPONGE 4X4 12PLY STRL LF (GAUZE/BANDAGES/DRESSINGS) ×1 IMPLANT
GLOVE BIO SURGEON STRL SZ 6.5 (GLOVE) ×1 IMPLANT
GLOVE BIO SURGEON STRL SZ7.5 (GLOVE) ×4 IMPLANT
GLOVE ECLIPSE 6.0 STRL STRAW (GLOVE) ×2 IMPLANT
GOWN PREVENTION PLUS XLARGE (GOWN DISPOSABLE) ×1 IMPLANT
GOWN STRL NON-REIN LRG LVL3 (GOWN DISPOSABLE) ×1 IMPLANT
GOWN STRL REIN XL XLG (GOWN DISPOSABLE) ×1 IMPLANT
GUIDEWIRE SUPER STIFF (WIRE) ×2 IMPLANT
HOLDER FOLEY CATH W/STRAP (MISCELLANEOUS) ×2 IMPLANT
KIT PROSTATE PRESICE I (KITS) IMPLANT
NDL SPNL 18GX3.5 QUINCKE PK (NEEDLE) IMPLANT
NEEDLE SPNL 18GX3.5 QUINCKE PK (NEEDLE) IMPLANT
PACK CYSTOSCOPY (CUSTOM PROCEDURE TRAY) ×2 IMPLANT
PLUG CATH AND CAP STER (CATHETERS) ×2 IMPLANT
SPONGE GAUZE 4X4 12PLY (GAUZE/BANDAGES/DRESSINGS) IMPLANT
SYRINGE 10CC LL (SYRINGE) ×2 IMPLANT
SYRINGE IRR TOOMEY STRL 70CC (SYRINGE) IMPLANT
UNDERPAD 30X30 INCONTINENT (UNDERPADS AND DIAPERS) ×2 IMPLANT
WATER STERILE IRR 500ML POUR (IV SOLUTION) ×2 IMPLANT

## 2012-01-24 NOTE — Transfer of Care (Signed)
Immediate Anesthesia Transfer of Care Note  Patient: Terry [name]  Procedure(s) Performed: Procedure(s) (LRB): CRYO ABLATION PROSTATE (N/A)  Patient Location: PACU  Anesthesia Type: General  Level of Consciousness: drowsy, responds to name, follows commands  Airway & Oxygen Therapy: Patient Spontanous Breathing and Patient connected to face mask oxygen  Post-op Assessment: Report given to PACU RN and Post -op Vital signs reviewed and stable  Post vital signs: Reviewed and stable  Complications: No apparent anesthesia complications

## 2012-01-24 NOTE — Interval H&P Note (Signed)
History and Physical Interval Note:  01/24/2012 8:59 AM  Terry [name]  has presented today for surgery, with the diagnosis of PROSTATE CANCER  The various methods of treatment have been discussed with the patient and family. After consideration of risks, benefits and other options for treatment, the patient has consented to  Procedure(s) (LRB): CRYO ABLATION PROSTATE (N/A) as a surgical intervention .  The patients' history has been reviewed, patient examined, no change in status, stable for surgery.  I have reviewed the patients' chart and labs.  Questions were answered to the patient's satisfaction.     Jethro Bolus I

## 2012-01-24 NOTE — Anesthesia Postprocedure Evaluation (Signed)
  Anesthesia Post-op Note  Patient: Terry [name]  Procedure(s) Performed: Procedure(s) (LRB): CRYO ABLATION PROSTATE (N/A)  Patient Location: PACU  Anesthesia Type: General  Level of Consciousness: awake and alert   Airway and Oxygen Therapy: Patient Spontanous Breathing  Post-op Pain: mild  Post-op Assessment: Post-op Vital signs reviewed, Patient's Cardiovascular Status Stable, Respiratory Function Stable, Patent Airway and No signs of Nausea or vomiting  Post-op Vital Signs: stable  Complications: No apparent anesthesia complications

## 2012-01-24 NOTE — Op Note (Signed)
Pre-operative diagnosis: T1 C. adenocarcinoma prostate, Gleason 6  Postop diagnosis: Same  Operation: Cryotherapy T1 C. prostate cancer  Surgeon: Mynor Witkop  Anesthesia: LMA  Complications: None  Preparation: After appropriate preanesthesia, the patient was brought to the operating room, and placed upon the operating table in the dorsal supine position, where general LMA anesthesia was introduced. He was then replaced in the dorsal lithotomy position, where the pubis was prepped with Betadine solution and draped in usual fashion. Foley catheter was placed, and the scrotum was taped to the abdominal wall. "Timeout" was observed. The patient had been given IV antibiotic, and IV Toradol ( at end of procedure). History: this 78 yo male has a hx of  G6 CaP, and bladder outlet obstruction ( IPSS=17). PSA 1.8, and 60cc gland, with L prostate nodule. Note medical hx of A. Fib on coumadin therapy.   Procedure: The patient underwent 2 cycles of cryotherapy, with active thaw on the first freeze/thaw cycle, and passive thaw on the second freeze/thaw cycle. Cystoscopy at the beginning of the procedure was accomplished, and showed normal urethra, with  evidence of 2 needles within the prostate, requiring replacement and repeat cystoscopy showing no needles within the prostate or the bladder. The prostate was 2+ enlarged, measuring 46 cc by ultrasound volume. The bladder neck was normal. The bladder had no trabeculation, no cellules, no bladder stones, tumors, or diverticula. Clear reflux was seen from both cortices. A guidewire was placed in the bladder, and the cystoscope removed, and a urethral warming catheter was placed, and left in place for the entire procedure, and not removed for 20 minutes after the second fall was begun. At that point, the catheter was removed, and a Foley catheter was placed to straight drainage. The patient was given IV Toradol at the end of the procedure. He was awakened and taken to  recovery room in good condition. He was given a B&O suppository.

## 2012-01-24 NOTE — Anesthesia Preprocedure Evaluation (Addendum)
Anesthesia Evaluation  Patient identified by MRN, date of birth, ID band Patient awake    Reviewed: Allergy & Precautions, H&P , NPO status , Patient's Chart, lab work & pertinent test results  Airway Mallampati: II TM Distance: >3 FB Neck ROM: full    Dental No notable dental hx. (+) Teeth Intact and Dental Advisory Given   Pulmonary neg pulmonary ROS,  breath sounds clear to auscultation  Pulmonary exam normal       Cardiovascular Exercise Tolerance: Good hypertension, Pt. on medications + CAD negative cardio ROS  + dysrhythmias Atrial Fibrillation Rhythm:Irregular Rate:Bradycardia  2008 cardiolite OK.  Slow ventricular rate with AF in the 40s. Cardiac clearance in 12/12.   Neuro/Psych negative neurological ROS  negative psych ROS   GI/Hepatic negative GI ROS, Neg liver ROS,   Endo/Other  negative endocrine ROS  Renal/GU negative Renal ROS  negative genitourinary   Musculoskeletal   Abdominal   Peds  Hematology negative hematology ROS (+)   Anesthesia Other Findings   Reproductive/Obstetrics negative OB ROS                         Anesthesia Physical Anesthesia Plan  ASA: III  Anesthesia Plan: General   Post-op Pain Management:    Induction: Intravenous  Airway Management Planned: LMA  Additional Equipment:   Intra-op Plan:   Post-operative Plan:   Informed Consent: I have reviewed the patients History and Physical, chart, labs and discussed the procedure including the risks, benefits and alternatives for the proposed anesthesia with the patient or authorized representative who has indicated his/her understanding and acceptance.   Dental Advisory Given  Plan Discussed with: CRNA and Surgeon  Anesthesia Plan Comments:         Anesthesia Quick Evaluation

## 2012-01-24 NOTE — Anesthesia Procedure Notes (Signed)
Procedure Name: LMA Insertion Date/Time: 01/24/2012 9:59 AM Performed by: Huel Coventry Pre-anesthesia Checklist: Patient identified, Emergency Drugs available, Suction available and Patient being monitored Patient Re-evaluated:Patient Re-evaluated prior to inductionOxygen Delivery Method: Circle System Utilized Preoxygenation: Pre-oxygenation with 100% oxygen Intubation Type: IV induction Ventilation: Mask ventilation without difficulty LMA: LMA inserted LMA Size: 4.0 Number of attempts: 1 Airway Equipment and Method: bite block Placement Confirmation: positive ETCO2 Tube secured with: Tape Dental Injury: Teeth and Oropharynx as per pre-operative assessment

## 2012-01-24 NOTE — Progress Notes (Signed)
Report given to Matilde Haymaker RN for lunch relief

## 2012-01-24 NOTE — H&P (Signed)
Urology Admission H&P  Chief Complaint: Prostate Cancer  History of Present Illness: The patient is an 78 year old male, with a history of adenocarcinoma prostate, was waiting protocol. He was referred by Dr. Catha Gosselin, equal at [facility]. The patient has a long-standing history of prostate nodule, but now has brown urinary drainage, and urgency, weak stream, and nocturia x3, despite Valium 5 mg to help relax his sphincter. He has a history of erectile dysfunction. He has failed medication therapy for this. His international prostate symptom score she was 17, with slow stream frequency and urgency. PSA was 1.8. Biopsy showed Gleason 3+3 carcinoma in 2 areas on his biopsy, MA 60.43 cc gland. Rectal examination showed left-sided induration or nodule. He was found to have multifocal carcinoma, with significant bladder L. Obstruction. He has a history of atrial fibrillation on Coumadin, as well as diazepam. After consultation, and was elected to proceed with cryotherapy, the treated for his prostate cancer, as well as his enlarged prostate.  Past Medical History  Diagnosis Date  . Hypertension   . History of BPH     reported by patient  . Prostate cancer   . Chronic anticoagulation   . PAF (paroxysmal atrial fibrillation)   . Coronary artery disease CARDIOLOGIST- DR Anne Fu LAST VISIT 2 MON AGO-- WILL REQUEST NOTE, STRESS TEST AND ECHO  . History of kidney stones 10 YRS AGO  . History of gout STABLE  . Arthritis   . Nocturia   . Frequency of urination   . RBBB (right bundle branch block)    Past Surgical History  Procedure Date  . Cataract extraction w/ intraocular lens  implant, bilateral   . Cardiovascular stress test 09-10-2007--  PER DR SKAINS NOTE    LOW RISK/ NO ISCHEMIA  . Transthoracic echocardiogram 11-08-2008    EF 65-70%/  NORMAL LVSF/ MILD AORTIC STENOSIS/ MOD. LEFT ATRIAL ENLARGEMENT  . Cardioversion X2   2001    Home Medications:  Prescriptions prior to admission    Medication Sig Dispense Refill  . allopurinol (ZYLOPRIM) 300 MG tablet Take 300 mg by mouth daily. For gout      . aspirin 81 MG tablet Take 160 mg by mouth daily.      . cloNIDine (CATAPRES) 0.1 MG tablet Take 0.1-0.2 mg by mouth 2 (two) times daily. Take 1 tablet in am and 2 tablets in pm      . lisinopril (PRINIVIL,ZESTRIL) 40 MG tablet Take 40-80 mg by mouth 2 (two) times daily. Take 2 tablets (80 mg) in am and 1 tablet (40 mg) in pm      . LOSARTAN POTASSIUM PO Take 50 mg by mouth 2 (two) times daily.      . Multiple Vitamins-Minerals (ICAPS PO) Take 2 capsules by mouth daily.       Marland Kitchen warfarin (COUMADIN) 2 MG tablet Take 3 mg by mouth daily. Take 1.5 tablets daily or as directed per MD/pharmacy  PT TO STOP TAKING 5 DAYS PRIOR TO SURG. ON 12-11-2011       Allergies: No Known Allergies  History reviewed. No pertinent family history. Social History:  reports that he quit smoking about 61 years ago. His smoking use included Cigarettes. His smokeless tobacco use includes Snuff. He reports that he drinks about 12.6 ounces of alcohol per week. He reports that he does not use illicit drugs.  ROS  Physical Exam:  Vital signs in last 24 hours: Temp:  [96.2 F (35.7 C)] 96.2 F (35.7 C) (03/08 0744) Pulse  Rate:  [38] 38  (03/08 0744) Resp:  [16] 16  (03/08 0744) BP: (150)/(55) 150/55 mmHg (03/08 0744) SpO2:  [99 %] 99 % (03/08 0757) Physical Exam  Laboratory Data:  Results for orders placed during the hospital encounter of 01/24/12 (from the past 24 hour(s))  PROTIME-INR     Status: Normal   Collection Time   01/24/12  7:50 AM      Component Value Range   Prothrombin Time 13.8  11.6 - 15.2 (seconds)   INR 1.04  0.00 - 1.49    No results found for this or any previous visit (from the past 240 hour(s)). Creatinine: No results found for this basename: CREATININE:7 in the last 168 hours Baseline Creatinine:   Impression/Assessment:  Locally aggressive course of the prostate, with  bladder outlet. symptoms.  Plan:  Cryotherapy the prostate.  Sha Amer I 01/24/2012, 8:55 AM

## 2012-02-03 ENCOUNTER — Encounter (HOSPITAL_BASED_OUTPATIENT_CLINIC_OR_DEPARTMENT_OTHER): Payer: Self-pay | Admitting: Urology

## 2012-06-25 ENCOUNTER — Encounter: Payer: Self-pay | Admitting: Gastroenterology

## 2012-07-16 ENCOUNTER — Encounter: Payer: Self-pay | Admitting: Gastroenterology

## 2012-07-16 ENCOUNTER — Ambulatory Visit (INDEPENDENT_AMBULATORY_CARE_PROVIDER_SITE_OTHER): Payer: Medicare Other | Admitting: Gastroenterology

## 2012-07-16 VITALS — BP 136/72 | HR 58 | Ht 71.0 in | Wt 157.0 lb

## 2012-07-16 DIAGNOSIS — K5730 Diverticulosis of large intestine without perforation or abscess without bleeding: Secondary | ICD-10-CM

## 2012-07-16 DIAGNOSIS — R634 Abnormal weight loss: Secondary | ICD-10-CM

## 2012-07-16 DIAGNOSIS — Z86010 Personal history of colonic polyps: Secondary | ICD-10-CM

## 2012-07-16 NOTE — Patient Instructions (Addendum)
Your physician has requested that you go to the basement for the following lab work before leaving today: IFOB test    

## 2012-07-16 NOTE — Progress Notes (Signed)
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 He attended [address], and during duty in [place], [place], and [place] he was first diagnosed with colon polyps and also proctalgia fugax. He's had multiple colonoscopies since that time by Dr. Corinda Gubler , apparently last exam in 1989. Also apparently in the past he was diagnosed with diverticulosis, but denies diverticulitis, and currently is having regular formed bowel movements without melena, hematochezia, or abdominal pain. He denies acid reflux symptoms, dysphagia, or any history of peptic ulcer disease. He did have hepatitis A as a child, but otherwise denies hepatitis or pancreatitis. His weight is fluctuated over the last several years, but he feels that his weight is currently fairly stable. He lives  with his wife, and he does the cooking. He denies a specific food intolerances, diarrhea, or loose stools, or any systemic complaints. He was recently diagnosed with prostate cancer and had cryotherapy in March of this year with Dr. Patsi Sears. Metastatic workup was not attempted. Patient does have a history of paroxysmal  atrial fibrillation, and is on regular Coumadin therapy. He does have easy bruisability but otherwise denies spontaneous bleeding. Review of his labs shows no evidence of anemia, elevated liver function test or metabolic disorders.  I have reviewed this patient's present history, medical and surgical past history, allergies and medications.    Allergies  Allergen Reactions  . Amlodipine     Ankle swelling   Outpatient Prescriptions Prior to Visit  Medication Sig Dispense Refill  . allopurinol (ZYLOPRIM) 300 MG tablet Take 300 mg by mouth daily. For gout      . aspirin 81 MG tablet Take 81 mg by mouth daily.      . cloNIDine (CATAPRES) 0.1 MG tablet Take 0.1-0.2 mg by mouth 2 (two) times daily.       . hydrochlorothiazide (HYDRODIURIL) 25 MG tablet Take 25 mg by mouth 2  (two) times daily.       Marland Kitchen lisinopril (PRINIVIL,ZESTRIL) 40 MG tablet Take 40 mg by mouth daily.       Marland Kitchen LOSARTAN POTASSIUM PO Take 50 mg by mouth 2 (two) times daily.      . Multiple Vitamins-Minerals (ICAPS PO) Take 2 capsules by mouth daily.       Marland Kitchen warfarin (COUMADIN) 2 MG tablet Take 1.5 tablets (3 mg total) by mouth daily. Take 1.5 tablets daily or as directed per MD/pharmacy  PT TO STOP TAKING 5 DAYS PRIOR TO SURG. ON 12-11-2011  1 tablet  1   Past Medical History  Diagnosis Date  . Hypertension   . History of BPH     reported by patient  . Prostate cancer   . Chronic anticoagulation   . PAF (paroxysmal atrial fibrillation)   . Coronary artery disease CARDIOLOGIST- DR Anne Fu LAST VISIT 2 MON AGO-- WILL REQUEST NOTE, STRESS TEST AND ECHO  . History of kidney stones 10 YRS AGO  . History of gout STABLE  . Arthritis   . Nocturia   . Frequency of urination   . RBBB (right bundle branch block)   . History of pneumonia 2009  . Osteoarthrosis, unspecified whether generalized or localized, shoulder region   . Other specified cardiac dysrhythmias   . History of kidney stones    Past Surgical History  Procedure Date  . Cataract extraction w/ intraocular lens  implant, bilateral   . Cardiovascular stress test 09-10-2007--  PER DR SKAINS NOTE  LOW RISK/ NO ISCHEMIA  . Transthoracic echocardiogram 11-08-2008    EF 65-70%/  NORMAL LVSF/ MILD AORTIC STENOSIS/ MOD. LEFT ATRIAL ENLARGEMENT  . Cardioversion X2   2001  . Cryoablation 01/24/2012    Procedure: CRYO ABLATION PROSTATE;  Surgeon: Kathi Ludwig, MD;  Location: [facility];  Service: Urology;  Laterality: N/A;   History   Social History  . Marital Status: Married    Spouse Name: N/A    Number of Children: 2  . Years of Education: N/A   Occupational History  . Retired    Social History Main Topics  . Smoking status: Former Smoker    Types: Cigarettes    Quit date: 11/18/1950  . Smokeless  tobacco: Current User    Types: Snuff   Comment: USES 1 CAN OF SNUFF PER MONTH  . Alcohol Use: 1.8 oz/week    3 Cans of beer per week  . Drug Use: No  . Sexually Active: No   Other Topics Concern  . None   Social History Narrative   Daily caffeine    Family History  Problem Relation Age of Onset  . Heart failure Father     deceased age 58  . Stroke Mother     deceased       ROS: The remainder of the 10 point ROS is negative     Physical Exam: Blood pressure 136/72, pulse 58 and irregular, and weight 157 pounds with a BMI of 21.90. I cannot appreciate stigmata of chronic liver disease. General well developed well nourished patient in no acute distress, appearing their stated age Eyes PERRLA, no icterus, fundoscopic exam per opthamologist Skin no lesions noted, masses or thyromegaly. Neck supple, no adenopathy, no thyroid enlargement, no tenderness Chest clear to percussion and auscultation Heart no significant murmurs, gallops or rubs noted,, slow irregular rhythm noted. Abdomen no hepatosplenomegaly masses or tenderness, BS normal.  Rectal inspection normal no fissures, or fistulae noted.  No masses or tenderness on digital exam. Stool guaiac negative. Extremities no acute joint lesions, edema, phlebitis or evidence of cellulitis. Neurologic patient oriented x 3, cranial nerves intact, no focal neurologic deficits noted. Psychological mental status normal and normal affect.  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 His stools are guaiac-negative, there is no evidence of anemia, and he denies any gastrointestinal symptomatology. He does have a past history of colon polyps, last exam apparently in 1989. Apparently future colonoscopy at that time was not suggested because of his advanced age. Colonoscopy in this patient would be high risk in  this patient because of his age, cardiovascular issues, and anticoagulation. I spoke with the patient at length, and he is comfortable doing IFOB stool cards with observation. I will try to get input from Dr. Patsi Sears about his opinions per his prostate cancer. Another option would be to proceed with CT scan of the abdomen, but again this test would have to be justified, and I am not sure that we will be able to obtain any information upon which we will act. Review of his radiographs shows evidence of severe cervical spondylosis. He is to continue his high fiber diet as tolerated. I have ordered his old chart from our warehouse for review.   Please copy her primary care physician, referring physician, and pertinent subspecialists. Also Dr. Patsi Sears in urology and Dr. Ty Hilts and cardiology and Dr. Caryn Bee  Little in primary care Encounter Diagnoses  Name Primary?  . History of colon polyps Yes  . Weight loss

## 2012-07-28 ENCOUNTER — Other Ambulatory Visit: Payer: Medicare Other

## 2012-07-28 DIAGNOSIS — R634 Abnormal weight loss: Secondary | ICD-10-CM

## 2012-07-28 DIAGNOSIS — Z86010 Personal history of colonic polyps: Secondary | ICD-10-CM

## 2012-12-01 ENCOUNTER — Other Ambulatory Visit: Payer: Self-pay | Admitting: Urology

## 2012-12-11 ENCOUNTER — Encounter (HOSPITAL_COMMUNITY): Payer: Self-pay | Admitting: Pharmacy Technician

## 2012-12-15 ENCOUNTER — Encounter (HOSPITAL_COMMUNITY): Payer: Self-pay

## 2012-12-15 ENCOUNTER — Encounter (HOSPITAL_COMMUNITY)
Admission: RE | Admit: 2012-12-15 | Discharge: 2012-12-15 | Disposition: A | Discharge status: Home / Self Care | Payer: Medicare Other | Source: Ambulatory Visit | Attending: Urology | Admitting: Urology

## 2012-12-15 HISTORY — DX: Diverticulosis of intestine, part unspecified, without perforation or abscess without bleeding: K57.90

## 2012-12-15 HISTORY — DX: Hematuria, unspecified: R31.9

## 2012-12-15 HISTORY — DX: Unspecified asthma, uncomplicated: J45.909

## 2012-12-15 HISTORY — DX: Personal history of other infectious and parasitic diseases: Z86.19

## 2012-12-15 LAB — BASIC METABOLIC PANEL
Calcium: 9.2 mg/dL (ref 8.4–10.5)
GFR calc non Af Amer: 43 mL/min — ABNORMAL LOW (ref >90)
Glucose, Bld: 175 mg/dL — ABNORMAL HIGH (ref 70–99)
Sodium: 141 mEq/L (ref 135–145)

## 2012-12-15 LAB — CBC
HCT: 38.5 % — ABNORMAL LOW (ref 39.0–52.0)
Hemoglobin: 12.8 g/dL — ABNORMAL LOW (ref 13.0–17.0)
MCH: 31.2 pg (ref 26.0–34.0)
MCHC: 33.2 g/dL (ref 30.0–36.0)
MCV: 93.9 fL (ref 78.0–100.0)
Platelets: 210 10*3/uL (ref 150–400)
RBC: 4.1 MIL/uL — ABNORMAL LOW (ref 4.22–5.81)
RDW: 13.8 % (ref 11.5–15.5)
WBC: 6.9 10*3/uL (ref 4.0–10.5)

## 2012-12-15 LAB — PROTIME-INR: Prothrombin Time: 20.9 seconds — ABNORMAL HIGH (ref 11.6–15.2)

## 2012-12-15 LAB — SURGICAL PCR SCREEN: MRSA, PCR: NEGATIVE

## 2012-12-15 LAB — APTT: aPTT: 39 seconds — ABNORMAL HIGH (ref 24–37)

## 2012-12-15 NOTE — Patient Instructions (Addendum)
MUAD NOGA  12/15/2012                           YOUR PROCEDURE IS SCHEDULED ON:  12/24/12               PLEASE REPORT TO SHORT STAY CENTER AT :  6:00 AM               CALL THIS NUMBER IF ANY PROBLEMS THE DAY OF SURGERY :               [phone]                      REMEMBER:   Do not eat food or drink liquids AFTER MIDNIGHT   Take these medicines the morning of surgery with A SIP OF WATER:  ALLOPURINOL / CLONIDINE / MIRABEGRON   Do not wear jewelry, make-up   Do not wear lotions, powders, or perfumes.   Do not shave legs or underarms 12 hrs. before surgery (men may shave face)  Do not bring valuables to the hospital.  Contacts, dentures or bridgework may not be worn into surgery.  Leave suitcase in the car. After surgery it may be brought to your room.  For patients admitted to the hospital more than one night, checkout time is 11:00                          The day of discharge.   Patients discharged the day of surgery will not be allowed to drive home                             If going home same day of surgery, must have someone stay with you first                           24 hrs at home and arrange for some one to drive you home from hospital.    Special Instructions:   Please read over the following fact sheets that you were given:               1. MRSA  INFORMATION                      2.  PREPARING FOR SURGERY SHEET                                                X_____________________________________________________________________        Failure to follow these instructions may result in cancellation of your surgery

## 2012-12-15 NOTE — Progress Notes (Signed)
Abnormal BMET faxed to Dr. Patsi Sears thru [facility]

## 2012-12-23 NOTE — Anesthesia Preprocedure Evaluation (Addendum)
Anesthesia Evaluation  Patient identified by MRN, date of birth, ID band Patient awake    Reviewed: Allergy & Precautions, H&P , NPO status , Patient's Chart, lab work & pertinent test results  Airway Mallampati: II TM Distance: >3 FB Neck ROM: full    Dental No notable dental hx. (+) Teeth Intact and Dental Advisory Given   Pulmonary neg pulmonary ROS,  breath sounds clear to auscultation  Pulmonary exam normal       Cardiovascular Exercise Tolerance: Good hypertension, Pt. on medications + dysrhythmias Atrial Fibrillation Rhythm:Irregular Rate:Normal  RBBB.  ECG AF   Neuro/Psych negative neurological ROS  negative psych ROS   GI/Hepatic negative GI ROS, Neg liver ROS, GERD-  Controlled,  Endo/Other  negative endocrine ROS  Renal/GU negative Renal ROS  negative genitourinary   Musculoskeletal   Abdominal   Peds  Hematology negative hematology ROS (+)   Anesthesia Other Findings   Reproductive/Obstetrics negative OB ROS                          Anesthesia Physical Anesthesia Plan  ASA: III  Anesthesia Plan: General   Post-op Pain Management:    Induction: Intravenous  Airway Management Planned: LMA  Additional Equipment:   Intra-op Plan:   Post-operative Plan:   Informed Consent: I have reviewed the patients History and Physical, chart, labs and discussed the procedure including the risks, benefits and alternatives for the proposed anesthesia with the patient or authorized representative who has indicated his/her understanding and acceptance.   Dental Advisory Given  Plan Discussed with: CRNA and Surgeon  Anesthesia Plan Comments:         Anesthesia Quick Evaluation

## 2012-12-24 ENCOUNTER — Observation Stay (HOSPITAL_COMMUNITY)
Admission: RE | Admit: 2012-12-24 | Discharge: 2012-12-25 | Disposition: A | Discharge status: Home / Self Care | Payer: Medicare Other | Source: Ambulatory Visit | Attending: Urology | Admitting: Urology

## 2012-12-24 ENCOUNTER — Ambulatory Visit (HOSPITAL_COMMUNITY): Payer: Medicare Other | Admitting: Anesthesiology

## 2012-12-24 ENCOUNTER — Encounter (HOSPITAL_COMMUNITY): Payer: Self-pay | Admitting: *Deleted

## 2012-12-24 ENCOUNTER — Encounter (HOSPITAL_COMMUNITY)
Admission: RE | Disposition: A | Discharge status: Home / Self Care | Payer: Self-pay | Source: Ambulatory Visit | Attending: Urology

## 2012-12-24 ENCOUNTER — Encounter (HOSPITAL_COMMUNITY): Payer: Self-pay | Admitting: Anesthesiology

## 2012-12-24 DIAGNOSIS — J45909 Unspecified asthma, uncomplicated: Secondary | ICD-10-CM | POA: Insufficient documentation

## 2012-12-24 DIAGNOSIS — I10 Essential (primary) hypertension: Secondary | ICD-10-CM | POA: Insufficient documentation

## 2012-12-24 DIAGNOSIS — C679 Malignant neoplasm of bladder, unspecified: Secondary | ICD-10-CM

## 2012-12-24 DIAGNOSIS — Z7901 Long term (current) use of anticoagulants: Secondary | ICD-10-CM | POA: Insufficient documentation

## 2012-12-24 DIAGNOSIS — C670 Malignant neoplasm of trigone of bladder: Principal | ICD-10-CM | POA: Insufficient documentation

## 2012-12-24 DIAGNOSIS — I4891 Unspecified atrial fibrillation: Secondary | ICD-10-CM | POA: Insufficient documentation

## 2012-12-24 DIAGNOSIS — I252 Old myocardial infarction: Secondary | ICD-10-CM | POA: Insufficient documentation

## 2012-12-24 DIAGNOSIS — K219 Gastro-esophageal reflux disease without esophagitis: Secondary | ICD-10-CM | POA: Insufficient documentation

## 2012-12-24 DIAGNOSIS — Z79899 Other long term (current) drug therapy: Secondary | ICD-10-CM | POA: Insufficient documentation

## 2012-12-24 DIAGNOSIS — Z8546 Personal history of malignant neoplasm of prostate: Secondary | ICD-10-CM | POA: Insufficient documentation

## 2012-12-24 DIAGNOSIS — Z7982 Long term (current) use of aspirin: Secondary | ICD-10-CM | POA: Insufficient documentation

## 2012-12-24 HISTORY — PX: TRANSURETHRAL RESECTION OF BLADDER TUMOR: SHX2575

## 2012-12-24 SURGERY — TURBT (TRANSURETHRAL RESECTION OF BLADDER TUMOR)
Anesthesia: General | Wound class: Clean Contaminated

## 2012-12-24 MED ORDER — SODIUM CHLORIDE 0.9 % IR SOLN
Status: DC | PRN
  Administered 2012-12-24: 6000 mL

## 2012-12-24 MED ORDER — OCUVITE-LUTEIN PO CAPS
1.0000 | ORAL_CAPSULE | ORAL | Status: DC
  Administered 2012-12-24: 1 via ORAL
  Filled 2012-12-24 (×2): qty 1

## 2012-12-24 MED ORDER — WARFARIN SODIUM 2 MG PO TABS: 2.0000 mg | ORAL_TABLET | Freq: Every day | ORAL | Status: DC

## 2012-12-24 MED ORDER — WARFARIN SODIUM 3 MG PO TABS: 3.0000 mg | ORAL_TABLET | ORAL | Status: DC

## 2012-12-24 MED ORDER — CEFAZOLIN SODIUM-DEXTROSE 2-3 GM-% IV SOLR
2.0000 g | INTRAVENOUS | Status: AC
  Administered 2012-12-24: 2 g via INTRAVENOUS

## 2012-12-24 MED ORDER — DIPHENHYDRAMINE HCL 50 MG/ML IJ SOLN: 12.5000 mg | Freq: Four times a day (QID) | INTRAMUSCULAR | Status: DC | PRN

## 2012-12-24 MED ORDER — WARFARIN - PHARMACIST DOSING INPATIENT: Freq: Every day | Status: DC

## 2012-12-24 MED ORDER — BELLADONNA ALKALOIDS-OPIUM 16.2-60 MG RE SUPP
RECTAL | Status: DC | PRN
  Administered 2012-12-24: 1 via RECTAL

## 2012-12-24 MED ORDER — WARFARIN SODIUM 2 MG PO TABS
2.0000 mg | ORAL_TABLET | ORAL | Status: DC
  Filled 2012-12-24: qty 1

## 2012-12-24 MED ORDER — FENTANYL CITRATE 0.05 MG/ML IJ SOLN
INTRAMUSCULAR | Status: DC | PRN
  Administered 2012-12-24: 25 ug via INTRAVENOUS
  Administered 2012-12-24 (×2): 50 ug via INTRAVENOUS

## 2012-12-24 MED ORDER — LISINOPRIL 20 MG PO TABS
20.0000 mg | ORAL_TABLET | Freq: Two times a day (BID) | ORAL | Status: DC
  Administered 2012-12-24 – 2012-12-25 (×3): 20 mg via ORAL
  Filled 2012-12-24 (×4): qty 1

## 2012-12-24 MED ORDER — PROPOFOL 10 MG/ML IV BOLUS
INTRAVENOUS | Status: DC | PRN
  Administered 2012-12-24: 110 mg via INTRAVENOUS
  Administered 2012-12-24: 10 mg via INTRAVENOUS

## 2012-12-24 MED ORDER — LACTATED RINGERS IV SOLN
INTRAVENOUS | Status: DC
  Administered 2012-12-24: 1000 mL via INTRAVENOUS
  Administered 2012-12-24: 10:00:00 via INTRAVENOUS

## 2012-12-24 MED ORDER — ZOLPIDEM TARTRATE 5 MG PO TABS: 5.0000 mg | ORAL_TABLET | Freq: Every evening | ORAL | Status: DC | PRN

## 2012-12-24 MED ORDER — HYDROMORPHONE HCL PF 1 MG/ML IJ SOLN: 0.5000 mg | INTRAMUSCULAR | Status: DC | PRN

## 2012-12-24 MED ORDER — HYDROCODONE-ACETAMINOPHEN 5-325 MG PO TABS: 1.0000 | ORAL_TABLET | ORAL | Status: DC | PRN

## 2012-12-24 MED ORDER — INDIGOTINDISULFONATE SODIUM 8 MG/ML IJ SOLN
INTRAMUSCULAR | Status: DC | PRN
  Administered 2012-12-24: 5 mL via INTRAVENOUS

## 2012-12-24 MED ORDER — CEFAZOLIN SODIUM-DEXTROSE 2-3 GM-% IV SOLR
INTRAVENOUS | Status: AC
  Filled 2012-12-24: qty 50

## 2012-12-24 MED ORDER — LOSARTAN POTASSIUM 50 MG PO TABS
50.0000 mg | ORAL_TABLET | Freq: Two times a day (BID) | ORAL | Status: DC
  Administered 2012-12-24 – 2012-12-25 (×3): 50 mg via ORAL
  Filled 2012-12-24 (×4): qty 1

## 2012-12-24 MED ORDER — SODIUM CHLORIDE 0.9 % IR SOLN: 3000.0000 mL | Status: AC | PRN

## 2012-12-24 MED ORDER — HYDROCHLOROTHIAZIDE 25 MG PO TABS
25.0000 mg | ORAL_TABLET | Freq: Every day | ORAL | Status: DC
  Administered 2012-12-25: 25 mg via ORAL
  Filled 2012-12-24 (×2): qty 1

## 2012-12-24 MED ORDER — GLYCOPYRROLATE 0.2 MG/ML IJ SOLN
INTRAMUSCULAR | Status: DC | PRN
  Administered 2012-12-24: 0.2 mg via INTRAVENOUS

## 2012-12-24 MED ORDER — ONDANSETRON HCL 4 MG/2ML IJ SOLN: 4.0000 mg | INTRAMUSCULAR | Status: DC | PRN

## 2012-12-24 MED ORDER — BISACODYL 5 MG PO TBEC: 5.0000 mg | DELAYED_RELEASE_TABLET | Freq: Every day | ORAL | Status: DC | PRN

## 2012-12-24 MED ORDER — ALLOPURINOL 300 MG PO TABS
300.0000 mg | ORAL_TABLET | Freq: Every day | ORAL | Status: DC
  Administered 2012-12-24 – 2012-12-25 (×2): 300 mg via ORAL
  Filled 2012-12-24 (×2): qty 1

## 2012-12-24 MED ORDER — FENTANYL CITRATE 0.05 MG/ML IJ SOLN
INTRAMUSCULAR | Status: AC
  Filled 2012-12-24: qty 2

## 2012-12-24 MED ORDER — OXYBUTYNIN CHLORIDE 5 MG PO TABS
5.0000 mg | ORAL_TABLET | Freq: Three times a day (TID) | ORAL | Status: DC | PRN
  Filled 2012-12-24: qty 1

## 2012-12-24 MED ORDER — DOXYCYCLINE HYCLATE 100 MG PO TABS
100.0000 mg | ORAL_TABLET | Freq: Two times a day (BID) | ORAL | Status: DC
  Administered 2012-12-24 – 2012-12-25 (×3): 100 mg via ORAL
  Filled 2012-12-24 (×4): qty 1

## 2012-12-24 MED ORDER — BACITRACIN-NEOMYCIN-POLYMYXIN 400-5-5000 EX OINT: 1.0000 "application " | TOPICAL_OINTMENT | Freq: Three times a day (TID) | CUTANEOUS | Status: DC | PRN

## 2012-12-24 MED ORDER — CLONIDINE HCL 0.1 MG PO TABS
0.1000 mg | ORAL_TABLET | Freq: Two times a day (BID) | ORAL | Status: DC
  Administered 2012-12-24 – 2012-12-25 (×2): 0.1 mg via ORAL
  Filled 2012-12-24 (×4): qty 1

## 2012-12-24 MED ORDER — LIDOCAINE HCL 1 % IJ SOLN
INTRAMUSCULAR | Status: DC | PRN
  Administered 2012-12-24: 50 mg via INTRADERMAL

## 2012-12-24 MED ORDER — LACTATED RINGERS IV SOLN: INTRAVENOUS | Status: DC

## 2012-12-24 MED ORDER — DIPHENHYDRAMINE HCL 12.5 MG/5ML PO ELIX: 12.5000 mg | ORAL_SOLUTION | Freq: Four times a day (QID) | ORAL | Status: DC | PRN

## 2012-12-24 MED ORDER — SODIUM CHLORIDE 0.45 % IV SOLN
INTRAVENOUS | Status: DC
  Administered 2012-12-24 – 2012-12-25 (×3): via INTRAVENOUS

## 2012-12-24 MED ORDER — FENTANYL CITRATE 0.05 MG/ML IJ SOLN
25.0000 ug | INTRAMUSCULAR | Status: DC | PRN
  Administered 2012-12-24 (×2): 50 ug via INTRAVENOUS

## 2012-12-24 MED ORDER — WARFARIN SODIUM 2 MG PO TABS: 2.0000 mg | ORAL_TABLET | ORAL | Status: DC

## 2012-12-24 SURGICAL SUPPLY — 21 items
BAG URINE DRAINAGE (UROLOGICAL SUPPLIES) IMPLANT
BAG URO CATCHER STRL LF (DRAPE) ×2 IMPLANT
CATH HEMA 3WAY 30CC 24FR COUDE (CATHETERS) ×2 IMPLANT
CLOTH BEACON ORANGE TIMEOUT ST (SAFETY) ×2 IMPLANT
DRAPE CAMERA CLOSED 9X96 (DRAPES) ×2 IMPLANT
ELECT BUTTON HF 24-28F 2 30DE (ELECTRODE) ×1 IMPLANT
ELECT LOOP MED HF 24F 12D (CUTTING LOOP) ×1 IMPLANT
ELECT LOOP MED HF 24F 12D CBL (CLIP) ×2 IMPLANT
ELECT RESECT VAPORIZE 12D CBL (ELECTRODE) ×1 IMPLANT
GLOVE BIOGEL M STRL SZ7.5 (GLOVE) ×7 IMPLANT
GOWN STRL NON-REIN LRG LVL3 (GOWN DISPOSABLE) ×3 IMPLANT
GOWN STRL REIN XL XLG (GOWN DISPOSABLE) ×2 IMPLANT
HOLDER FOLEY CATH W/STRAP (MISCELLANEOUS) IMPLANT
IV NS IRRIG 3000ML ARTHROMATIC (IV SOLUTION) ×2 IMPLANT
KIT ASPIRATION TUBING (SET/KITS/TRAYS/PACK) ×2 IMPLANT
MANIFOLD NEPTUNE II (INSTRUMENTS) ×2 IMPLANT
NS IRRIG 1000ML POUR BTL (IV SOLUTION) ×2 IMPLANT
PACK CYSTO (CUSTOM PROCEDURE TRAY) ×2 IMPLANT
SCRUB PCMX 4 OZ (MISCELLANEOUS) IMPLANT
SYRINGE IRR TOOMEY STRL 70CC (SYRINGE) IMPLANT
TUBING CONNECTING 10 (TUBING) ×2 IMPLANT

## 2012-12-24 NOTE — Progress Notes (Signed)
ANTICOAGULATION CONSULT NOTE - Initial Consult  Pharmacy Consult for Warfarin to Begin 2/7 Indication: Hx of AFib  Allergies  Allergen Reactions  . Amlodipine     Ankle swelling    Patient Measurements: Height: 5\' 11"  (180.3 cm) IBW/kg (Calculated) : 75.3   Vital Signs: Temp: 97.4 F (36.3 C) (02/06 1115) Temp src: Oral (02/06 1115) BP: 129/55 mmHg (02/06 1115) Pulse Rate: 50  (02/06 1115)  Labs:  [facility] 12/24/12 0645  HGB --  HCT --  PLT --  APTT --  LABPROT 14.7  INR 1.17  HEPARINUNFRC --  CREATININE --  CKTOTAL --  CKMB --  TROPONINI --    The CrCl is unknown because both a height and weight (above a minimum accepted value) are required for this calculation.   Medical History: Past Medical History  Diagnosis Date  . Hypertension   . History of BPH     reported by patient  . Prostate cancer   . Chronic anticoagulation   . PAF (paroxysmal atrial fibrillation)   . Coronary artery disease CARDIOLOGIST- DR Anne Fu LAST VISIT 2 MON AGO-- WILL REQUEST NOTE, STRESS TEST AND ECHO  . History of gout STABLE  . Arthritis   . Nocturia   . Frequency of urination   . RBBB (right bundle branch block)   . History of pneumonia 2009  . Osteoarthrosis, unspecified whether generalized or localized, shoulder region   . Other specified cardiac dysrhythmias   . History of kidney stones   . Asthma     AS CHILD ONLY  . H/O hiatal hernia   . GERD (gastroesophageal reflux disease)   . History of hepatitis 1965    no residual problems  . Diverticulosis   . Hematuria     Anticoag or Interacting Medications:  Doxycycline 100mg  PO q12h  Assessment: 79 yo M s/p TURP on 2/6. Pt on chronic warfarin for hx of Afib, home dose reported as 2mg  on MF and 3mg  on TuWThSS - last taken 2/1 per Med Rec. Pt reported recent painless hematuria. "Alcohol use 4 per day" noted in H&P (amount not quantified). MD has ordered Coumadin Per Pharmacy consult, but has written the comment "home  dose to begin tomorrow". Current INR is subtherapeutic at 1.17. Based on this order, will start usual home regimen of warfarin tomorrow, and order daily INRs.  Goal of Therapy:  INR 2-3   Plan:  1) Home warfarin dose to resume tomorrow per MD order 2) Daily INRs 3) Monitor for signs and symptoms of hematuria (especially with drug interaction with doxycycline)  Darrol Angel, PharmD Pager: [phone] 12/24/2012,11:56 AM

## 2012-12-24 NOTE — Anesthesia Postprocedure Evaluation (Signed)
  Anesthesia Post-op Note  Patient: Terry [name]  Procedure(s) Performed: Procedure(s) (LRB): TRANSURETHRAL RESECTION OF BLADDER TUMOR (TURBT) (N/A)  Patient Location: PACU  Anesthesia Type: General  Level of Consciousness: awake and alert   Airway and Oxygen Therapy: Patient Spontanous Breathing  Post-op Pain: mild  Post-op Assessment: Post-op Vital signs reviewed, Patient's Cardiovascular Status Stable, Respiratory Function Stable, Patent Airway and No signs of Nausea or vomiting  Last Vitals:  Filed Vitals:   12/24/12 1045  BP: 121/47  Pulse: 50  Temp:   Resp: 16    Post-op Vital Signs: stable   Complications: No apparent anesthesia complications

## 2012-12-24 NOTE — Interval H&P Note (Signed)
History and Physical Interval Note:  12/24/2012 8:49 AM  Terry [name]  has presented today for surgery, with the diagnosis of bladder cancer   The various methods of treatment have been discussed with the patient and family. After consideration of risks, benefits and other options for treatment, the patient has consented to  Procedure(s) (LRB) with comments: TRANSURETHRAL RESECTION OF BLADDER TUMOR (TURBT) (N/A) as a surgical intervention .  The patient's history has been reviewed, patient examined, no change in status, stable for surgery.  I have reviewed the patient's chart and labs.  Questions were answered to the patient's satisfaction.     Jethro Bolus I

## 2012-12-24 NOTE — H&P (Signed)
Reason For Visit  Cystoscopy & review CT results   History of Present Illness           79 YO male patient returns today for a cystoscopy & to review CT results.  He was last seen by Jetta Lout, NP on 10/08/12 for complaint of gross hematuria X 3 days.   Hx of prostate cancer & urge incontinence.  Currently taking Myrbetriq 50mg .    S/P cyro-ablation therapy prostate 01/24/12 for PCA: Gleason: 3+3=6.  Recent PSA: 9/13: 0.03      Hx of prostate nodule, brown urinary drainage and longstanding issue with urgency,  weak stream and nocturia x 3.  He is taking Diazepam 5mg  to help relax his sphincter.  Interval HX:  Today states over past 6 months a 30 lb unintentional weight loss. Painless gross hematuria X 3 days. Does take Warafin and ASA 81 mg 1 po daily. Last PT/INR 2 weeks ago (2 range). Denies recent fall. No h/o nephrolithiasis. H/O smokeless tobacco (dips) X 20 yrs. No smoking X 60 yrs.   Past Medical History Problems  1. History of  Acute Myocardial Infarction V12.59 2. History of  Asthma 493.90 3. History of  Atrial Fibrillation 427.31 4. History of  Esophageal Reflux 530.81 5. History of  Gout 274.9 6. History of  Hepatitis 573.3 7. History of  Hypertension 401.9  Surgical History Problems  1. History of  No Surgical Problems 2. History of  Surgery Prostate Cryosurgical Ablation  Current Meds 1. Allopurinol 300 MG Oral Tablet; Therapy: (Recorded:16Sep2013) to 2. Aspirin 81 MG Oral Tablet; Therapy: (Recorded:18Jul2012) to 3. Azelastine HCl SOLN; Therapy: (Recorded:16Sep2013) to 4. CloNIDine HCl 0.1 MG Oral Tablet; Therapy: (Recorded:05Dec2012) to 5. Hydrochlorothiazide 25 MG Oral Tablet; Therapy: (Recorded:16Sep2013) to 6. ICaps CAPS; Therapy: (Recorded:13Aug2012) to 7. Lisinopril 40 MG Oral Tablet; Therapy: (Recorded:18Jul2012) to 8. Losartan Potassium 50 MG Oral Tablet; Therapy: (Recorded:16Sep2013) to 9. Myrbetriq 50 MG Oral Tablet Extended Release 24 Hour; Take 1  tablet daily; Therapy: 14May2013  to (Evaluate:09May2014)  Requested for: 14May2013; Last Rx:14May2013 10. Warfarin Sodium 2 MG Oral Tablet; Therapy: (Recorded:18Jul2012) to  Allergies Medication  1. No Known Drug Allergies  Family History Problems  1. Family history of  Family Health Status Number Of Children 2 sons 2. Family history of  Father Deceased At Age 20 stroke 3. Family history of  Mother Deceased At Age 64 natural causes 4. Maternal history of  Transient Ischemic Attack 5. Paternal history of  Transient Ischemic Attack  Social History Problems  1. Alcohol Use 4 per day 2. Caffeine Use 1 per day 3. Former Smoker V15.82 quit 60yrs ago 4. Marital History - Currently Married 5. Marital History - Widowed 6. Occupation: Retired  Event organiser, constitutional, skin, eye, otolaryngeal, hematologic/lymphatic, cardiovascular, pulmonary, endocrine, musculoskeletal, gastrointestinal, neurological and psychiatric system(s) were reviewed and pertinent findings if present are noted.  Genitourinary: feelings of urinary urgency, nocturia, incontinence and hematuria.    Vitals Vital Signs [Data Includes: Last 1 Day]  18Dec2013 11:08AM  Blood Pressure: 127 / 68 Temperature: 97.4 F Heart Rate: 71  Physical Exam Rectal: Rectal exam demonstrates decreased sphincter tone. Estimated prostate size is 4+. The prostate is indurated involving the entire left lobe of the prostate which appears to be confined within the prostate capsule, is not tender and is not fluctuant. The left seminal vesicle is nonpalpable. The right seminal vesicle is nonpalpable.  Genitourinary: Examination of the penis demonstrates no discharge, no masses, no lesions and a  normal meatus. The penis is circumcised. The scrotum is without lesions. The right epididymis is palpably normal and non-tender. The left epididymis is palpably normal and non-tender. The right testis is non-tender and without  masses. The left testis is non-tender and without masses.    Results/Data Urine [Data Includes: Last 1 Day]   18Dec2013  COLOR YELLOW   APPEARANCE CLOUDY   SPECIFIC GRAVITY 1.025   pH 5.0   GLUCOSE NEG mg/dL  BILIRUBIN NEG   KETONE TRACE mg/dL  BLOOD LARGE   PROTEIN 30 mg/dL  UROBILINOGEN 0.2 mg/dL  NITRITE NEG   LEUKOCYTE ESTERASE TRACE   SQUAMOUS EPITHELIAL/HPF FEW   WBC 7-10 WBC/hpf  RBC TNTC RBC/hpf  BACTERIA MANY   CRYSTALS NONE SEEN   CASTS NONE SEEN   Selected Results  UA With REFLEX 18Dec2013 10:47AM Terry [name]   Test Name Result Flag Reference  COLOR YELLOW  YELLOW  APPEARANCE CLOUDY A CLEAR  SPECIFIC GRAVITY 1.025  1.005-1.030  pH 5.0  5.0-8.0  GLUCOSE NEG mg/dL  NEG  BILIRUBIN NEG  NEG  KETONE TRACE mg/dL A NEG  BLOOD LARGE A NEG  PROTEIN 30 mg/dL A NEG  UROBILINOGEN 0.2 mg/dL  1.6-1.0  NITRITE NEG  NEG  LEUKOCYTE ESTERASE TRACE A NEG  SQUAMOUS EPITHELIAL/HPF FEW  RARE  WBC 7-10 WBC/hpf A <3  RBC TNTC RBC/hpf A <3  BACTERIA MANY A RARE  CRYSTALS NONE SEEN  NONE SEEN  CASTS NONE SEEN  NONE SEEN   AU CT-HEMATURIA PROTOCOL 22Nov2013 12:00AM Terry [name]   Test Name Result Flag Reference  ** RADIOLOGY REPORT BY [facility] **   *RADIOLOGY REPORT*  Clinical Data: Gross hematuria and microhematuria.  CT ABDOMEN AND PELVIS WITHOUT AND WITH CONTRAST  Technique: Multidetector CT imaging of the abdomen and pelvis was performed without contrast material in one or both body regions, followed by contrast material(s) and further sections in one or both body regions.  Contrast: 125 ml of Isovue 300.  Comparison: No priors.  Findings:  Lung Bases: Small amount of pleural fluid and/or thickening in the periphery of the right hemithorax (image 11 of series 3) with some adjacent atelectasis in the right lower lobe. Borderline cardiomegaly. Atherosclerotic calcifications in the distal left anterior descending and right  coronary arteries. Calcifications of the aortic valve.  Abdomen/Pelvis: No abnormal calcifications within the collecting system of either kidney, along the course of either ureter, or within the lumen of the urinary bladder. No hydroureteronephrosis or perinephric stranding to suggest urinary tract obstruction at this time. There is multifocal mild cortical thinning in the kidneys bilaterally, likely to reflect chronic scarring. In the anterior aspect of the interpolar region of the right kidney there is a subcentimeter low attenuation lesion which is too small to definitively characterize, but is favored to represent a small cyst. On post contrast delayed images there is marked irregular bladder wall thickening which is most pronounced in the inferior aspect of the bladder on the right side where there is a polypoid mass like area that measures approximately 2.7 x 0.7 cm, concerning for urothelial neoplasm. This area appears to enhance avidly on the early arterial phase images as well.  The liver has a slightly shrunken appearance and slight nodular contour, suggestive of cirrhosis. In segment 5 of the liver (image 37 of series 2) is a 2.1 x 4 cm intermediate attenuation (33 HU) lesion that is indeterminate. There is also a subcapsular 1.2 cm lesion and extending slightly exophytically off the  posterior aspect of segment 6 (image 27 of series 3) which is also indeterminate. 4.0 x 3.4 cm hepatic cyst in segment 2. Calcified gallstone in the gallbladder. No signs of acute cholecystitis at this time. The enhanced appearance of the pancreas, spleen and bilateral adrenal glands is unremarkable. Extensive atherosclerosis of the abdominal and pelvic vasculature, without definite aneurysm or dissection. Trace ascites. No pneumoperitoneum. No pathologic distension of small bowel. No definite pathologic lymphadenopathy identified within the abdomen pelvis. Numerous colonic diverticula,  without surrounding inflammatory changes to suggest acute diverticulitis at this time. Diverticular disease is most severe in the region of the sigmoid colon. Appendix.  Musculoskeletal: There are no aggressive appearing lytic or blastic lesions noted in the visualized portions of the skeleton.  IMPRESSION: 1. 2.7 x 1.7 cm pedunculated lesion inferiorly the right side of the urinary bladder is highly suspicious for urothelial neoplasm and may explain the patient's history of hematuria. 2. Subcentimeter low attenuation lesion in the anterior aspect of the interpolar region of the right kidney is too small to definitively characterize, but statistically likely to represent a small cyst. 3. Appearance of the liver suggests cirrhosis. There is a large cyst in segment 2 of the liver, in addition to two indeterminate lesions in segments 5 and 6 of the liver, as above. Neither of these lesions appears suspicious for hepatocellular carcinoma at this time and both lesions would require a contrast-enhanced MRI for full characterization if clinically indicated. 4. Cholelithiasis without findings to suggest acute cholecystitis. 5. Extensive colonic diverticulosis without findings to suggest acute diverticulitis. 6. Severe atherosclerosis including at least two-vessel coronary artery disease. 7. Additional incidental findings, as above.   Original Report Authenticated By: Trudie Reed, M.D.   BUN & CREATININE 21Nov2013 11:38AM Jetta Lout  SPECIMEN TYPE: BLOOD   Test Name Result Flag Reference  CREATININE 1.39 mg/dL  1.61-0.96  BUN 32 mg/dL H 0-45  Est GFR, African American 52 mL/min L   Est GFR, NonAfrican American 45 mL/min L   The estimated GFR is a calculation valid for adults (52 to 77 years old) that uses the CKD-EPI algorithm to adjust for age and sex. It is not to be used for children, pregnant women, hospitalized patients, patients on dialysis, or with rapidly changing  kidney function. According to the NKDEP, eGFR >89 is normal, 60-89 shows mild impairment, 30-59 shows moderate impairment, 15-29 shows severe impairment and <15 is ESRD.   PROTHROMBIN TIME (PT) 21Nov2013 05:39AM Jetta Lout  SPECIMEN TYPE: BLOOD   Test Name Result Flag Reference  PT (Prothrombin Time) 24.4 seconds H 11.6-15.2  INR 2.39 H <1.50  The INR is of principal utility in following patients on stable doses of oral anticoagulants.  The therapeutic range is generally 2.0 to 3.0, but may be 3.0 to 4.0 in patients with mechanical cardiac valves, recurrent embolisms and antiphospholipid antibodies (including lupus inhibitors).   Procedure  Procedure: Cystoscopy  Chaperone Present: laura.  Indication: Hematuria. Lower Urinary Tract Symptoms.  Informed Consent: Risks, benefits, and potential adverse events were discussed and informed consent was obtained from the patient.  Prep: The patient was prepped with betadine.  Anesthesia:. Local anesthesia was administered intraurethrally with 2% lidocaine jelly.  Antibiotic prophylaxis: Ciprofloxacin.  Procedure Note:  Urethral meatus:. No abnormalities.  Anterior urethra: No abnormalities.  Prostatic urethra:. The lateral and median prostatic lobes were enlarged. An enlarged intravesical median lobe was visualized.  Bladder: Visulization was obscured due to cloudy urine. The ureteral orifices were in the normal anatomic  position bilaterally. Examination of the bladder demonstrated trabeculation, but no clot within the bladder and no diverticulum no fistula, no erythematous mucosa, no edema and no cellules. Multiple tumors were identified in the bladder. A papillary tumor was seen in the bladder measuring approximately 3 cm in size. This tumor was located on the right side, on the anterior aspect, at the base of the bladder. Another papillary tumor was seen in the bladder measuring approximately 0.5 cm in size. This tumor was located on the  right side, at the base of the bladder. The patient tolerated the procedure well.  Complications: None.    Assessment Assessed  1. Gross Hematuria 599.71 2. Prostate Cancer 185 3. Bladder Cancer 188.9   R bladder base tumor and satellite lesion. He needs TUR-BT.   Plan  Schedule TUR-BT.   Signatures Electronically signed by : Terry [name], M.D.; Nov 04 2012  4:44PM

## 2012-12-24 NOTE — Op Note (Signed)
Pre-operative diagnosis :   Multiple trigonal bladder tumors  Postoperative diagnosis:  Same  Operation:  Cystourethroscopy, transurethral resection of multiple trigonal bladder tumors (5 cm x 5 cm, and 2 cm x 2 cm)  Surgeon:  S. Patsi Sears, MD  First assistant:  None  Anesthesia:  general  Preparation:  After appropriate preanesthesia, the patient was brought to the operating room, placed on the operating table in the dorsal supine position, where general LMA anesthesia was introduced. The arm band was double checked. He was replaced in the dorsal lithotomy position where the pubis was prepped with Betadine solution and draped in usual fashion. Review history was undertaken.  Review history:   YO male patient returns today for a cystoscopy & to review CT results. He was last seen by Jetta Lout, NP on 10/08/12 for complaint of gross hematuria X 3 days.  Hx of prostate cancer & urge incontinence. Currently taking Myrbetriq 50mg .  S/P cyro-ablation therapy prostate 01/24/12 for PCA: Gleason: 3+3=6. Recent PSA: 9/13: 0.03  Hx of prostate nodule, brown urinary drainage and longstanding issue with urgency, weak stream and nocturia x 3. He is taking Diazepam 5mg  to help relax his sphincter.  Interval HX:  Today states over past 6 months a 30 lb unintentional weight loss. Painless gross hematuria X 3 days. Does take Warafin and ASA 81 mg 1 po daily. Last PT/INR 2 weeks ago (2 range). Denies recent fall. No h/o nephrolithiasis. H/O smokeless tobacco (dips) X 20 yrs. No smoking X 60 yrs.    Statement of  Likelihood of Success: Excellent. TIME-OUT observed.:  Procedure:   Cystourethroscopy is a undertaken, and the submucosal space was tight, requiring dilation to a size 28 [place]. Following this, further cystoscopy revealed normal-appearing proximal urethra. The 0 was intact. The lateral lobes of the prostate were intact. The bladder neck was elevated. The trigone was intact, and a very large 5 cm  bladder tumor was identified overlying the right ureteral orifice. The right ureteral orifice could not be identified. There was a second 2 cm x 2 cm left trigonal tumor. The ureteral orifice on the left was identified. Indigocarmine was given. After 20 minutes, indigocarmine could be seen from the left ureteral orifice. The right ureteral orifice was completely secured by tumor, and could not be identified until after tumor was resected.  Resection of the large right ureteral bladder tumor was accomplished, and tissue was sent to laboratory for examination. Note was taken of the size of the tumor, and the location. In addition, pathology was asked to evaluate for muscle invasion. The level of left trigonal tumor was then resected, but the tumor desiccated, and could not be evacuated for pathologic evaluation. This appeared to be a superficial tumor.  After indigocarmine was identified bilaterally, and copious amounts, no bleeding was noted. A 24 hematuria catheter was placed. CBI was placed, but only when necessary. The patient will be admitted for observation. He was not given IV Tylenol because they history of hepatitis. He was awakened, taken to recovery room in stable condition.

## 2012-12-24 NOTE — Transfer of Care (Signed)
Immediate Anesthesia Transfer of Care Note  Patient: Terry [name]  Procedure(s) Performed: Procedure(s) (LRB) with comments: TRANSURETHRAL RESECTION OF BLADDER TUMOR (TURBT) (N/A)  Patient Location: PACU  Anesthesia Type:General  Level of Consciousness: awake, alert , oriented and patient cooperative  Airway & Oxygen Therapy: Patient connected to face mask oxygen  Post-op Assessment: Report given to PACU RN, Post -op Vital signs reviewed and stable and Patient moving all extremities  Post vital signs: Reviewed and stable  Complications: No apparent anesthesia complications

## 2012-12-24 NOTE — Progress Notes (Signed)
Patient ambulated in hallway with minimal assistance needed. Patient ambulated >100 feet. Incentive spirometry encouraged.  Will continue to monitor patient. Setzer, Don Broach

## 2012-12-25 ENCOUNTER — Encounter (HOSPITAL_COMMUNITY): Payer: Self-pay | Admitting: Urology

## 2012-12-25 MED ORDER — CEPHALEXIN 500 MG PO CAPS: 500.0000 mg | ORAL_CAPSULE | Freq: Two times a day (BID) | ORAL | Status: DC

## 2012-12-25 MED ORDER — OXYBUTYNIN CHLORIDE 5 MG PO TABS: 5.0000 mg | ORAL_TABLET | Freq: Three times a day (TID) | ORAL | Status: DC | PRN

## 2012-12-25 MED ORDER — OXYCODONE-ACETAMINOPHEN 5-325 MG PO TABS: 1.0000 | ORAL_TABLET | ORAL | Status: DC | PRN

## 2012-12-25 MED ORDER — URELLE 81 MG PO TABS: 1.0000 | ORAL_TABLET | Freq: Three times a day (TID) | ORAL | Status: DC

## 2012-12-25 MED FILL — Lidocaine HCl Gel 2%: CUTANEOUS | Qty: 10 | Status: AC

## 2012-12-25 NOTE — Progress Notes (Signed)
Prescriptions were not signed by Dr. Patsi Sears. MD on call, Warner Mccreedy was paged and agreed to call in meds to pt's pharmacy.

## 2012-12-25 NOTE — Discharge Summary (Signed)
Physician Discharge Summary  Patient ID: Terry [name] MRN: [id number] DOB/AGE: November 06, 1921 79 y.o.  Admit date: 12/24/2012 Discharge date: 12/25/2012  Admission Diagnoses: bladder cancer   Discharge Diagnoses:  Active Problems:  * No active hospital problems. *    Discharged Condition: good  Hospital Course:   TUR-BT x 2 on 2/ 06/ 14   Significant Diagnostic StudiesPathologyINAL DIAGNOSIS Diagnosis  Surgical Pathology: Bladder, transurethral resection, right base and trigone - INVASIVE ADENOCARCINOMA WITH ABUNDANT EXTRACELLULAR MUCIN, INVOLVING THE MUSCULARIS PROPRIA. Microscopic Comment Sections show well differentiated adenocarcinoma with abundant extracellular mucin. The tumor involves the muscularis propria. There is no definitive evidence of angiolymphatic invasion identified in this material. Clinical correlation is recommended. Dr. Imelda Pillow nurse was notified on 12/25/12. (HCL:gt, 12/25/12) Gwendolyn Grant LI MD Pathologist, Electronic Signature (Case signed 12/25/2012) Specimen Gross and Clinical Information Specimen(s) Obtained: Bladder, transurethral resection, right base and trigone Specimen Clinical  Treatments: IV hydration, antibiotics: Ancef, analgesia: acetaminophen w/ codeine and surgery: TUR-BT  Discharge Exam: Blood pressure 123/39, pulse 53, temperature 97.5 F (36.4 C), temperature source Oral, resp. rate 18, height 5\' 11"  (1.803 m), weight 72.576 kg (160 lb), SpO2 100.00%. General appearance: alert and cooperative  Disposition: 06-Home-Health Care Svc  Discharge Orders    Future Orders Please Complete By Expires   Foley catheter - discontinue      Discontinue IV          Medication List     As of 12/25/2012  5:33 PM    TAKE these medications         allopurinol 300 MG tablet   Commonly known as: ZYLOPRIM   Take 300 mg by mouth daily. For gout      aspirin 81 MG tablet   Take 81 mg by mouth daily.      cephALEXin 500 MG capsule   Commonly known as: KEFLEX   Take 1 capsule (500 mg total) by mouth 2 (two) times daily.      cloNIDine 0.1 MG tablet   Commonly known as: CATAPRES   Take 0.1 mg by mouth 2 (two) times daily.      hydrochlorothiazide 25 MG tablet   Commonly known as: HYDRODIURIL   Take 25 mg by mouth daily before breakfast.      ICAPS PO   Take 2 capsules by mouth daily.      lisinopril 40 MG tablet   Commonly known as: PRINIVIL,ZESTRIL   Take 20 mg by mouth 2 (two) times daily. Takes 1/2      losartan 50 MG tablet   Commonly known as: COZAAR   Take 50 mg by mouth 2 (two) times daily.      MYRBETRIQ 50 MG Tb24   Generic drug: mirabegron ER   Take 50 mg by mouth daily before breakfast.      oxybutynin 5 MG tablet   Commonly known as: DITROPAN   Take 1 tablet (5 mg total) by mouth every 8 (eight) hours as needed.      oxyCODONE-acetaminophen 5-325 MG per tablet   Commonly known as: PERCOCET/ROXICET   Take 1 tablet by mouth every 4 (four) hours as needed for pain.      URELLE 81 MG Tabs   Take 1 tablet (81 mg total) by mouth 3 (three) times daily.      warfarin 2 MG tablet   Commonly known as: COUMADIN   Take 2-3 mg by mouth daily before breakfast. Takes 1and 1/2   To equal 3 mgtablet every  day except Monday and Friday he takes 1 tablet =2 mg           Follow-up Information    Follow up with Jethro Bolus I, MD. (per appointment)    Contact information:   [address] [place] [address] [phone]         Pt to RTC to discuss surgical pathology Signed: Jethro Bolus I 12/25/2012, 5:33 PM

## 2012-12-25 NOTE — Progress Notes (Signed)
Urology Progress Note  1 Day Post-Op   Subjective: Post op TUR-BT    No acute urologic events overnight. Ambulation:   positive Flatus:    positive Bowel movement  positive  Pain: some relief  Objective:  Blood pressure 123/39, pulse 53, temperature 97.5 F (36.4 C), temperature source Oral, resp. rate 18, height 5\' 11"  (1.803 m), weight 72.576 kg (160 lb), SpO2 100.00%.  Physical Exam:  General:  No acute distress, awake Extremities: extremities normal, atraumatic, no cyanosis or edema Genitourinary:   Foley: no CBI needed. Urine slightly pink Foley: to be removed    I/O last 3 completed shifts: In: 6418.3 [P.O.:240; I.V.:2978.3; Other:3200] Out: 4835 [Urine:4190; Emesis/NG output:540; Blood:105]  No results found for this basename: HGB:2,WBC:2,PLT:2 in the last 72 hours  No results found for this basename: NA:2,K:2,CL:2,CO2:2,BUN:2,CREATININE:2,CALCIUM:2,MAGNESIUM:2,GFRNONAA:2,GFRAA:2 in the last 72 hours   Recent Labs  Basename 12/25/12 0433 12/24/12 0645   INR 1.17 1.17   APTT -- --     No components found with this basename: ABG:2  Assessment/Plan:  Catheter removed. Follow up per appointment

## 2012-12-25 NOTE — Progress Notes (Signed)
ANTICOAGULATION CONSULT NOTE - (Brief)  Pharmacy Consult for Warfarin to Begin 2/7 per MD order Indication: Hx of AFib  Labs:  [facility] 12/25/12 0433 12/24/12 0645  HGB -- --  HCT -- --  PLT -- --  APTT -- --  LABPROT 14.7 14.7  INR 1.17 1.17  HEPARINUNFRC -- --  CREATININE -- --  CKTOTAL -- --  CKMB -- --  TROPONINI -- --    Anticoag or Interacting Medications:  Doxycycline 100mg  PO q12h Warfarin 2mg  on Mon and Fri Warfarin 3mg  on Sun, Tue, Wed, Thurs, Sat  Assessment: 79 yo M s/p TURP on 2/6. Pt on chronic warfarin for hx of Afib, home dose reported as 2mg  on MF and 3mg  on TuWThSS - last taken 2/1 per Med Rec. Pt reported recent painless hematuria. MD has ordered Coumadin Per Pharmacy consult, but has written the comment "home dose to begin tomorrow". Current INR is subtherapeutic at 1.17. Warfarin orders begin today.  Goal of Therapy:  INR 2-3   Plan:  1) Pharmacy will only follow peripherally for now - will not write daily notes unless it's felt the home dose needs to be adjusted based on INR trend. 2) Daily INR has been ordered. 3) Monitor for signs and symptoms of hematuria (especially with drug interaction with doxycycline)  Darrol Angel, PharmD Pager: [phone] 12/25/2012,7:17 AM

## 2013-01-06 ENCOUNTER — Other Ambulatory Visit (HOSPITAL_COMMUNITY): Payer: Self-pay | Admitting: Urology

## 2013-01-08 ENCOUNTER — Other Ambulatory Visit: Payer: Self-pay | Admitting: Dermatology

## 2013-01-15 ENCOUNTER — Encounter (HOSPITAL_COMMUNITY): Admission: RE | Admit: 2013-01-15 | Payer: Medicare Other | Source: Ambulatory Visit

## 2013-01-15 ENCOUNTER — Encounter (HOSPITAL_COMMUNITY)
Admission: RE | Admit: 2013-01-15 | Discharge: 2013-01-15 | Disposition: A | Discharge status: Home / Self Care | Payer: Medicare Other | Source: Ambulatory Visit | Attending: Urology | Admitting: Urology

## 2013-01-15 DIAGNOSIS — C679 Malignant neoplasm of bladder, unspecified: Secondary | ICD-10-CM | POA: Insufficient documentation

## 2013-01-15 MED ORDER — TECHNETIUM TC 99M MEDRONATE IV KIT
25.0000 | PACK | Freq: Once | INTRAVENOUS | Status: AC | PRN
  Administered 2013-01-15: 25 via INTRAVENOUS

## 2013-03-03 ENCOUNTER — Telehealth: Payer: Self-pay | Admitting: Oncology

## 2013-03-04 ENCOUNTER — Telehealth: Payer: Self-pay | Admitting: Oncology

## 2013-03-04 NOTE — Telephone Encounter (Signed)
S/W PT IN REF TO NP APPT. 03/16/13@10 :30 REFERRING DR Emerson [place] MAILED NP PACKET

## 2013-03-05 ENCOUNTER — Telehealth: Payer: Self-pay | Admitting: Oncology

## 2013-03-05 NOTE — Telephone Encounter (Signed)
C/D 03/05/13 for appt. 03/16/13

## 2013-03-11 ENCOUNTER — Other Ambulatory Visit: Payer: Self-pay | Admitting: Oncology

## 2013-03-11 DIAGNOSIS — C679 Malignant neoplasm of bladder, unspecified: Secondary | ICD-10-CM

## 2013-03-15 ENCOUNTER — Telehealth: Payer: Self-pay | Admitting: *Deleted

## 2013-03-15 NOTE — Telephone Encounter (Signed)
Called to remind pt of appt 03/16/13. Pt aware of location and time of appt, verbalized and confirmed he will be here at 1030am. No further questions

## 2013-03-16 ENCOUNTER — Other Ambulatory Visit (HOSPITAL_BASED_OUTPATIENT_CLINIC_OR_DEPARTMENT_OTHER): Payer: Medicare Other | Admitting: Lab

## 2013-03-16 ENCOUNTER — Ambulatory Visit: Payer: Medicare Other

## 2013-03-16 ENCOUNTER — Ambulatory Visit (HOSPITAL_BASED_OUTPATIENT_CLINIC_OR_DEPARTMENT_OTHER): Payer: Medicare Other | Admitting: Oncology

## 2013-03-16 ENCOUNTER — Telehealth: Payer: Self-pay | Admitting: Oncology

## 2013-03-16 ENCOUNTER — Encounter: Payer: Self-pay | Admitting: Oncology

## 2013-03-16 VITALS — BP 122/35 | HR 56 | Temp 97.7°F | Resp 16 | Ht 71.0 in | Wt 161.9 lb

## 2013-03-16 DIAGNOSIS — C679 Malignant neoplasm of bladder, unspecified: Secondary | ICD-10-CM

## 2013-03-16 DIAGNOSIS — C61 Malignant neoplasm of prostate: Secondary | ICD-10-CM

## 2013-03-16 DIAGNOSIS — M949 Disorder of cartilage, unspecified: Secondary | ICD-10-CM

## 2013-03-16 LAB — CBC WITH DIFFERENTIAL/PLATELET
BASO%: 0.4 % (ref 0.0–2.0)
Eosinophils Absolute: 0 10*3/uL (ref 0.0–0.5)
HCT: 37.4 % — ABNORMAL LOW (ref 38.4–49.9)
LYMPH%: 26.3 % (ref 14.0–49.0)
MCHC: 33 g/dL (ref 32.0–36.0)
MONO#: 0.6 10*3/uL (ref 0.1–0.9)
NEUT%: 63.7 % (ref 39.0–75.0)
Platelets: 192 10*3/uL (ref 140–400)
WBC: 6.6 10*3/uL (ref 4.0–10.3)

## 2013-03-16 LAB — COMPREHENSIVE METABOLIC PANEL (CC13)
CO2: 26 mEq/L (ref 22–29)
Creatinine: 1.5 mg/dL — ABNORMAL HIGH (ref 0.7–1.3)
Glucose: 137 mg/dl — ABNORMAL HIGH (ref 70–99)
Total Bilirubin: 0.65 mg/dL (ref 0.20–1.20)

## 2013-03-16 NOTE — Progress Notes (Signed)
Checked in new pt with no financial concerns. °

## 2013-03-16 NOTE — Progress Notes (Signed)
Note dictated

## 2013-03-17 NOTE — Progress Notes (Signed)
CC:   Anna Genre. Little, M.D. Sigmund I. Patsi Sears, M.D. Sebastian Ache, MD  REASON FOR CONSULTATION:  Bladder cancer.  HISTORY OF PRESENT ILLNESS:  Mr. Ranny Wiebelhaus is an 79 year old gentleman, currently of [place], has lived in multiple locations after being born in [place].  He worked with the [employer] until his retirement, and has had other occupations on the side.  For the most part now, he is retired and lives independently.  Despite his age, he has been in reasonably good health and reasonable shape.  He still continues to attend to most of his activities of daily living, including driving and preparing food.  He is a gentleman with a history of hypertension as well as atrial fibrillation.  He has also a newly diagnosed adenocarcinoma of the prostate.  He has been followed with Dr. Berneice Heinrich as well as Dr. Patsi Sears.  Initial staging of his prostate cancer was T1c, with a Gleason score of 3+3=6.  He had cryoablation, but subsequently he started developing weight loss and frequent urination, and underwent a TURBT of his bladder tumor.  The pathology from that (case #[id number], dated 12/24/2012) showed well-differentiated adenocarcinoma, the tumor involving the muscularis propria, with no definitive evidence of any angiolymphatic invasion.  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  The case was discussed in the Genitourinary Tumor Board, and the patient had a staging workup as well as evaluation by Dr. Max Fickle at [facility].  He had a PET-CT scan done on 02/24/2013, and that PET-CT scan showed evidence of intensely FDG-avid retroperitoneal and pelvic lymphadenopathy, worrisome for metastatic disease.   There was also mild increased uptake in the L4 vertebral body, which is nonspecific at this time.  Based on these findings, I discussed the treatment option for him at this point.  Certainly surgery was not an option, and he was given the option of palliative chemotherapy; that includes 5-FU, cisplatin, and potentially gemcitabine.  The patient wanted his care closer to home, and for that reason was referred to me for establishment of care.  At this point, today after evaluation Mr. Denardo is asymptomatic.  He has not reported any back pain.  He has not reported any abdominal pain.  He does report a weight loss, about a 30-pound weight loss in the last year-and-a-half.  He does report some frequency with urination that has not really changed, but other than that, really no deterioration in his performance status or activity level at this point.  He had not reported any hematuria, had not reported any appetite changes, and no further decline in his dietary appetite or in his ability to perform activities of daily living.  REVIEW OF SYSTEMS:  He does not report any headaches, blurry vision, double vision.  Does not report any motor or sensory neuropathy.  Does not report any alteration in mental status.  Does not report any psychiatric issues or depression.  Does not report any fever, chills, sweats.  Does not report any cough, hemoptysis, or hematemesis.  No nausea, vomiting.  Does not report any  abdominal pain, hematochezia, melena, or genitourinary complaints.  Rest of review of systems is unremarkable.  PAST MEDICAL HISTORY:  Significant for prostate cancer, history of hypertension, atrial fibrillation, history of GERD.  He has a history of asthma and  gout.  MEDICATIONS: 1. Allopurinol. 2. Aspirin. 3. Clonidine. 4. Hydrochlorothiazide. 5. Lisinopril. 6. Losartan. 7. Myrbetriq. 8. He also was on Coumadin.  ALLERGIES:  None.  FAMILY HISTORY:  No history of any  malignancies noted.  SOCIAL HISTORY:  He is widowed.  He has 2 children.  Drinks about 2 to 3 beers a day.  Quit smoking about 50 years ago.  PHYSICAL EXAMINATION:  General:  Alert, awake gentleman, appeared in no active distress today.  Vital Signs:  His blood pressure is 122/35, pulse 56, respirations 16, temperature is 97.  HEENT:  Head is normocephalic, atraumatic.  Pupils are equal, round, reactive to light. Oral mucosa moist and pink.  Neck:  Supple, without lymphadenopathy. Heart:  Regular rate and rhythm.  S1, S2.  Lungs:  Clear to auscultation.  Abdomen:  Soft, nontender.  No hepatosplenomegaly. Extremities:  No clubbing, cyanosis, or edema.  Neurologically:  Intact motor, sensory, and deep tendon reflexes.  LABORATORY DATA:  Showed a hemoglobin of 12.3, white cell count 6.6, platelet count 192, creatinine of 1.5.  Normal liver function tests. Total protein 6.2.  ASSESSMENT AND PLAN:  An 79 year old gentleman with the following issues: 1. 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  He has metastatic disease to pelvic adenopathy as     well as possible L4.  The natural course of bladder cancer was     discussed today in details and, more specifically, a stage IV     disease.  I also talked to him in detail about the unusual     component of bladder adenocarcinoma and urachal adenocarcinoma.  At     this point, I reiterated what Dr. Max Fickle has mentioned in his     consultation, that really any treatment at this point would be     merely palliative.  He is a reasonable candidate for a single-agent     systemic chemotherapy.  The goals would be to reduce complications     from this cancer, that include pain, hematuria, and decline in his     quality of life.  None of these things have happened at this point.     I have talked to him about the toxicity associated with     chemotherapy, the simplest of which will be single-agent      gemcitabine.  Complications include infusion-related toxicity,     fevers, myelosuppression, fatigue, tiredness, and deterioration in     his performance status, also the complication with intravenous     administration and IV placement.  At this point, in his mind the     risks associated with chemotherapy really do not justify the     benefit, and I do not disagree with Mr. Earnest assessment.  It     seems to be that is his preference as well as his 2 sons'     preference at this time.  I have given them the option of the     alternative to chemotherapy, which would be supportive care only     and continued observation, and intervening whenever he becomes     symptomatic from his cancer.  I also explained to him that the     window of opportunity could be very small and it could close very     fast, and if his cancer starts affecting his quality of life and he     has a quick deterioration, he  might not be able to get chemotherapy     anyway.  At that  point, hospice enrollment would be the way to go.     I have talked to him in detail today about hospice.  In general,     given his age and advanced cancer in general, he would be a     reasonable candidate down the line if he started showing signs of     deterioration.  I have talked to him about the logistics of that as     well. 2. Bony disease:  He has very limited bony disease at L4.  I will hold     off on any bone-directed therapy at this point.  He did have a bone     scan back on 01/15/2013, which showed a diffuse uptake at L4     vertebral body, could be related to degenerative changes, but could     also be suspicious for metastatic bone cancer. 3. Prostate cancer:  In 2013 diagnosed with a Gleason's score 3+3=6     early stage prostate cancer.  His PSA was 0.03 most recently, and     he is status post cryoablation. 4. Hematuria:  That has resolved at this point, no longer reporting     any symptoms, but that was what  prompted his initial cystoscopy.     Will continue to monitor that.  All of his questions were answered     today.    ______________________________ Benjiman Core, M.D. FNS/MEDQ  D:  03/16/2013  T:  03/17/2013  Job:  [id number]

## 2013-05-04 ENCOUNTER — Other Ambulatory Visit (HOSPITAL_BASED_OUTPATIENT_CLINIC_OR_DEPARTMENT_OTHER): Payer: Medicare Other | Admitting: Lab

## 2013-05-04 ENCOUNTER — Ambulatory Visit (HOSPITAL_BASED_OUTPATIENT_CLINIC_OR_DEPARTMENT_OTHER): Payer: Medicare Other | Admitting: Oncology

## 2013-05-04 ENCOUNTER — Telehealth: Payer: Self-pay | Admitting: Oncology

## 2013-05-04 VITALS — BP 126/36 | HR 66 | Temp 97.4°F | Resp 17 | Ht 71.0 in | Wt 161.3 lb

## 2013-05-04 DIAGNOSIS — C679 Malignant neoplasm of bladder, unspecified: Secondary | ICD-10-CM

## 2013-05-04 LAB — CBC WITH DIFFERENTIAL/PLATELET
Eosinophils Absolute: 0.2 10*3/uL (ref 0.0–0.5)
LYMPH%: 29.9 % (ref 14.0–49.0)
MONO#: 0.7 10*3/uL (ref 0.1–0.9)
NEUT#: 4.3 10*3/uL (ref 1.5–6.5)
Platelets: 224 10*3/uL (ref 140–400)
RBC: 3.63 10*6/uL — ABNORMAL LOW (ref 4.20–5.82)
WBC: 7.4 10*3/uL (ref 4.0–10.3)
lymph#: 2.2 10*3/uL (ref 0.9–3.3)

## 2013-05-04 LAB — COMPREHENSIVE METABOLIC PANEL (CC13)
ALT: 7 U/L (ref 0–55)
Albumin: 3.5 g/dL (ref 3.5–5.0)
CO2: 21 mEq/L — ABNORMAL LOW (ref 22–29)
Calcium: 9.1 mg/dL (ref 8.4–10.4)
Chloride: 114 mEq/L — ABNORMAL HIGH (ref 98–107)
Glucose: 121 mg/dl — ABNORMAL HIGH (ref 70–99)
Potassium: 4.5 mEq/L (ref 3.5–5.1)
Sodium: 144 mEq/L (ref 136–145)
Total Protein: 5.9 g/dL — ABNORMAL LOW (ref 6.4–8.3)

## 2013-05-04 NOTE — Progress Notes (Signed)
Hematology and Oncology Follow Up Visit  Terry [name] [id number] November 27, 1920 79 y.o. 05/04/2013 9:34 AM   Principle Diagnosis: 79 year old gentleman with stage IV adenocarcinoma of the bladder diagnosed in April of 2014. A PET CT scan showed retroperitoneal, pelvic adenopathy and possible L4 bone lesion.   Prior Therapy: He is status post TURBT on February of 2014 with the pathology showing well-differentiated adenocarcinoma tumor involving the muscularis propria.  Current therapy: Supportive care only he declined chemotherapy for the time being.  Interim History:  Terry [name] presents today for a followup visit. He is a very nice man I saw in April of 2014 with advanced adenocarcinoma of the bladder. He has metastatic disease to the pelvic adenopathy and possible L4 lesion. We have discussed risks and benefits of chemotherapy he elected to pursue supportive care only given his age and lack of symptomatology. Since his last visit she's continue be asymptomatic. Is not reporting any abdominal pain or hematuria. Is not reporting any back pain or deterioration in his performance status or quality of life. Is continued to be active with an excellent appetite and weight continue to be stable.  Medications: I have reviewed the patient's current medications.  Current Outpatient Prescriptions  Medication Sig Dispense Refill  . allopurinol (ZYLOPRIM) 300 MG tablet Take 300 mg by mouth daily. For gout      . aspirin 81 MG tablet Take 81 mg by mouth daily.      . cephALEXin (KEFLEX) 500 MG capsule Take 1 capsule (500 mg total) by mouth 2 (two) times daily.  10 capsule  0  . cloNIDine (CATAPRES) 0.1 MG tablet Take 0.1 mg by mouth 2 (two) times daily.       . hydrochlorothiazide (HYDRODIURIL) 25 MG tablet Take 25 mg by mouth daily before breakfast.       . lisinopril (PRINIVIL,ZESTRIL) 40 MG tablet Take 20 mg by mouth 2 (two) times daily. Takes 1/2      . losartan (COZAAR) 50 MG tablet Take 50 mg by  mouth 2 (two) times daily.      . mirabegron ER (MYRBETRIQ) 50 MG TB24 Take 50 mg by mouth daily before breakfast.      . Multiple Vitamins-Minerals (ICAPS PO) Take 2 capsules by mouth daily.       Marland Kitchen oxybutynin (DITROPAN) 5 MG tablet Take 1 tablet (5 mg total) by mouth every 8 (eight) hours as needed.  30 tablet  1  . oxyCODONE-acetaminophen (ROXICET) 5-325 MG per tablet Take 1 tablet by mouth every 4 (four) hours as needed for pain.  30 tablet  0  . URELLE (URELLE/URISED) 81 MG TABS Take 1 tablet (81 mg total) by mouth 3 (three) times daily.  30 each  2  . warfarin (COUMADIN) 2 MG tablet Take 2-3 mg by mouth daily before breakfast. Takes 1and 1/2   To equal 3 mgtablet every day except Monday and Friday he takes 1 tablet =2 mg       No current facility-administered medications for this visit.     Allergies:  Allergies  Allergen Reactions  . Amlodipine     Ankle swelling    Past Medical History, Surgical history, Social history, and Family History were reviewed and updated.  Review of Systems: Constitutional:  Negative for fever, chills, night sweats, anorexia, weight loss, pain. Cardiovascular: no chest pain or dyspnea on exertion Respiratory: negative Neurological: negative Dermatological: negative ENT: negative Skin: Negative. Gastrointestinal: negative Genito-Urinary: negative Hematological and Lymphatic: negative Breast: negative Musculoskeletal:  negative Remaining ROS negative. Physical Exam: Blood pressure 126/36, pulse 66, temperature 97.4 F (36.3 C), temperature source Oral, resp. rate 17, height 5\' 11"  (1.803 m), weight 161 lb 4.8 oz (73.165 kg), SpO2 99.00%. ECOG: 1 General appearance: alert Head: Normocephalic, without obvious abnormality, atraumatic Neck: no adenopathy, no carotid bruit, no JVD, supple, symmetrical, trachea midline and thyroid not enlarged, symmetric, no tenderness/mass/nodules Lymph nodes: Cervical, supraclavicular, and axillary nodes  normal. Heart:regular rate and rhythm, S1, S2 normal, no murmur, click, rub or gallop Lung:chest clear, no wheezing, rales, normal symmetric air entry Abdomin: soft, non-tender, without masses or organomegaly EXT:no erythema, induration, or nodules   Lab Results: Lab Results  Component Value Date   WBC 7.4 05/04/2013   HGB 11.5* 05/04/2013   HCT 33.8* 05/04/2013   MCV 93.1 05/04/2013   PLT 224 05/04/2013     Chemistry      Component Value Date/Time   NA 143 03/16/2013 1037   NA 141 12/15/2012 1200   K 4.7 03/16/2013 1037   K 4.6 12/15/2012 1200   CL 109* 03/16/2013 1037   CL 107 12/15/2012 1200   CO2 26 03/16/2013 1037   CO2 24 12/15/2012 1200   BUN 29.9* 03/16/2013 1037   BUN 30* 12/15/2012 1200   CREATININE 1.5* 03/16/2013 1037   CREATININE 1.41* 12/15/2012 1200      Component Value Date/Time   CALCIUM 9.2 03/16/2013 1037   CALCIUM 9.2 12/15/2012 1200   ALKPHOS 91 03/16/2013 1037   AST 18 03/16/2013 1037   ALT 11 03/16/2013 1037   BILITOT 0.65 03/16/2013 1037       Impression and Plan:  79 year old gentleman with the following issues:  1. Stage IV adenocarcinoma of the bladder: I reiterated the risks and benefits of systemic chemotherapy which would be palliative in nature but he continue to prefer supportive care only. He is continued to be asymptomatic and has reasonable quality of life which she does not we'll wishe to change. If things change however, you might reconsider the use of chemotherapy. He also understands that the window of opportunity to treat him with chemotherapy but close fairly quick and at that time hospice would be involved.  2. Bony disease: He has very limited disease at L4 and he is asymptomatic from it. He develops back pain will obtain an MRI and address that area.  3. Hematuria: This is resolved and his hemoglobin is fairly stable.  4. History of prostate cancer: Diagnosed in 2013 Gleason score 3+3 equals 6 he is status post cryoablation his PSA is under  control and this is really a nonissue at this point.  [facility], MD 6/17/20149:34 AM

## 2013-05-04 NOTE — Telephone Encounter (Signed)
Gave pt appt for lab and MD on August 2014 °

## 2013-07-06 ENCOUNTER — Ambulatory Visit (HOSPITAL_BASED_OUTPATIENT_CLINIC_OR_DEPARTMENT_OTHER): Payer: Medicare Other | Admitting: Oncology

## 2013-07-06 ENCOUNTER — Telehealth: Payer: Self-pay | Admitting: Oncology

## 2013-07-06 ENCOUNTER — Other Ambulatory Visit (HOSPITAL_BASED_OUTPATIENT_CLINIC_OR_DEPARTMENT_OTHER): Payer: Medicare Other | Admitting: Lab

## 2013-07-06 VITALS — BP 117/31 | HR 54 | Temp 97.5°F | Resp 17 | Ht 71.0 in | Wt 163.8 lb

## 2013-07-06 DIAGNOSIS — C679 Malignant neoplasm of bladder, unspecified: Secondary | ICD-10-CM

## 2013-07-06 DIAGNOSIS — C7951 Secondary malignant neoplasm of bone: Secondary | ICD-10-CM

## 2013-07-06 DIAGNOSIS — C61 Malignant neoplasm of prostate: Secondary | ICD-10-CM

## 2013-07-06 LAB — COMPREHENSIVE METABOLIC PANEL (CC13)
ALT: 13 U/L (ref 0–55)
Albumin: 3.4 g/dL — ABNORMAL LOW (ref 3.5–5.0)
Alkaline Phosphatase: 95 U/L (ref 40–150)
CO2: 22 mEq/L (ref 22–29)
Glucose: 124 mg/dl (ref 70–140)
Potassium: 5 mEq/L (ref 3.5–5.1)
Sodium: 144 mEq/L (ref 136–145)
Total Bilirubin: 0.51 mg/dL (ref 0.20–1.20)
Total Protein: 5.9 g/dL — ABNORMAL LOW (ref 6.4–8.3)

## 2013-07-06 LAB — CBC WITH DIFFERENTIAL/PLATELET
BASO%: 0.4 % (ref 0.0–2.0)
Eosinophils Absolute: 0.1 10*3/uL (ref 0.0–0.5)
MCHC: 33.3 g/dL (ref 32.0–36.0)
MONO#: 0.8 10*3/uL (ref 0.1–0.9)
NEUT#: 3.8 10*3/uL (ref 1.5–6.5)
RBC: 3.63 10*6/uL — ABNORMAL LOW (ref 4.20–5.82)
RDW: 14.4 % (ref 11.0–14.6)
WBC: 6.8 10*3/uL (ref 4.0–10.3)

## 2013-07-06 NOTE — Telephone Encounter (Signed)
Gave pt appt for lab and Md on November 2014

## 2013-07-06 NOTE — Progress Notes (Signed)
Hematology and Oncology Follow Up Visit  Terry [name] [id number] 1921-05-31 79 y.o. 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 A PET CT scan showed retroperitoneal, pelvic adenopathy and possible L4 bone lesion.   Prior Therapy: He is status post TURBT on February of 2014 with the pathology showing well-differentiated adenocarcinoma tumor involving the muscularis propria.  Current therapy: Supportive care only he declined chemotherapy for the time being.  Interim History:  Terry [name] presents today for a followup visit. He is a very nice man I saw in April of 2014 with advanced adenocarcinoma of the bladder. He has metastatic disease to the pelvic adenopathy and possible L4 lesion. We have discussed risks and benefits of chemotherapy he elected to pursue supportive care only given his age and lack of symptomatology. Since his last visit she's continue be asymptomatic. Is not reporting any abdominal pain or hematuria. Is not reporting any back pain or deterioration in his performance status or quality of life. Is continued to be active with an excellent appetite and weight continue to be stable. He still refuses chemotherapy.   Medications: I have reviewed the patient's current medications.  Current Outpatient Prescriptions  Medication Sig Dispense Refill  . allopurinol (ZYLOPRIM) 300 MG tablet Take 300 mg by mouth daily. For gout      . aspirin 81 MG tablet Take 81 mg by mouth daily.      . cephALEXin (KEFLEX) 500 MG capsule Take 1 capsule (500 mg total) by mouth 2 (two) times daily.  10 capsule  0  . cloNIDine (CATAPRES) 0.1 MG tablet Take 0.1 mg by mouth 2 (two) times daily.       . hydrochlorothiazide (HYDRODIURIL) 25 MG tablet Take 25 mg by mouth daily before breakfast.       . lisinopril (PRINIVIL,ZESTRIL) 40 MG tablet Take 20 mg by mouth 2 (two) times daily. Takes 1/2      . losartan  (COZAAR) 50 MG tablet Take 50 mg by mouth 2 (two) times daily.      . mirabegron ER (MYRBETRIQ) 50 MG TB24 Take 50 mg by mouth daily before breakfast.      . Multiple Vitamins-Minerals (ICAPS PO) Take 2 capsules by mouth daily.       Marland Kitchen oxybutynin (DITROPAN) 5 MG tablet Take 1 tablet (5 mg total) by mouth every 8 (eight) hours as needed.  30 tablet  1  . oxyCODONE-acetaminophen (ROXICET) 5-325 MG per tablet Take 1 tablet by mouth every 4 (four) hours as needed for pain.  30 tablet  0  . URELLE (URELLE/URISED) 81 MG TABS Take 1 tablet (81 mg total) by mouth 3 (three) times daily.  30 each  2  . warfarin (COUMADIN) 2 MG tablet Take 2-3 mg by mouth daily before breakfast. Takes 1and 1/2   To equal 3 mgtablet every day except Monday and Friday he takes 1 tablet =2 mg       No current facility-administered medications for this visit.     Allergies:  Allergies  Allergen Reactions  . Amlodipine     Ankle swelling    Past Medical History, Surgical history, Social history, and Family History were reviewed and updated.  Review of Systems: Constitutional:  Negative for fever, chills, night sweats, anorexia, weight loss, pain. Cardiovascular: no chest pain or dyspnea on exertion Respiratory: negative Neurological: negative Dermatological: negative ENT: negative Skin: Negative. Gastrointestinal: negative Genito-Urinary: negative Hematological and  Lymphatic: negative Breast: negative Musculoskeletal: negative Remaining ROS negative. Physical Exam: Blood pressure 117/31, pulse 54, temperature 97.5 F (36.4 C), temperature source Oral, resp. rate 17, height 5\' 11"  (1.803 m), weight 163 lb 12.8 oz (74.299 kg). ECOG: 1 General appearance: alert Head: Normocephalic, without obvious abnormality, atraumatic Neck: no adenopathy, no carotid bruit, no JVD, supple, symmetrical, trachea midline and thyroid not enlarged, symmetric, no tenderness/mass/nodules Lymph nodes: Cervical, supraclavicular, and  axillary nodes normal. Heart:regular rate and rhythm, S1, S2 normal, no murmur, click, rub or gallop Lung:chest clear, no wheezing, rales, normal symmetric air entry Abdomin: soft, non-tender, without masses or organomegaly EXT:no erythema, induration, or nodules   Lab Results: Lab Results  Component Value Date   WBC 6.8 07/06/2013   HGB 11.5* 07/06/2013   HCT 34.5* 07/06/2013   MCV 95.1 07/06/2013   PLT 221 07/06/2013     Chemistry      Component Value Date/Time   NA 144 05/04/2013 0858   NA 141 12/15/2012 1200   K 4.5 05/04/2013 0858   K 4.6 12/15/2012 1200   CL 114* 05/04/2013 0858   CL 107 12/15/2012 1200   CO2 21* 05/04/2013 0858   CO2 24 12/15/2012 1200   BUN 33.9* 05/04/2013 0858   BUN 30* 12/15/2012 1200   CREATININE 1.1 05/04/2013 0858   CREATININE 1.41* 12/15/2012 1200      Component Value Date/Time   CALCIUM 9.1 05/04/2013 0858   CALCIUM 9.2 12/15/2012 1200   ALKPHOS 110 05/04/2013 0858   AST 15 05/04/2013 0858   ALT 7 05/04/2013 0858   BILITOT 1.03 05/04/2013 0858       Impression and Plan:  79 year old gentleman with the following issues:  1. Stage IV adenocarcinoma of the bladder: I reiterated the risks and benefits of systemic chemotherapy which would be palliative in nature but he continue to prefer supportive care only. He is continued to be asymptomatic and has reasonable quality of life which she does not we'll wishe to change. If things change however, you might reconsider the use of chemotherapy. He also understands that the window of opportunity to treat him with chemotherapy but close fairly quick and at that time hospice would be involved. He will follow up in 3 months.   2. Bony disease: He has very limited disease at L4 and he is asymptomatic from it. He develops back pain will obtain an MRI and address that area.  3. Hematuria: This is resolved and his hemoglobin is fairly stable.  4. History of prostate cancer: Diagnosed in 2013 Gleason score 3+3 equals 6 he  is status post cryoablation his PSA is under control and this is really a nonissue at this point.  [facility], MD 8/19/20149:45 AM

## 2013-07-13 ENCOUNTER — Other Ambulatory Visit: Payer: Medicare Other | Admitting: Lab

## 2013-07-14 ENCOUNTER — Ambulatory Visit: Payer: Medicare Other | Admitting: Physician Assistant

## 2013-08-27 ENCOUNTER — Ambulatory Visit (INDEPENDENT_AMBULATORY_CARE_PROVIDER_SITE_OTHER): Payer: Medicare Other | Admitting: Pharmacist

## 2013-08-27 ENCOUNTER — Encounter: Payer: Self-pay | Admitting: Cardiology

## 2013-08-27 DIAGNOSIS — I4891 Unspecified atrial fibrillation: Secondary | ICD-10-CM

## 2013-08-27 LAB — POCT INR: INR: 2.7

## 2013-09-13 ENCOUNTER — Ambulatory Visit (INDEPENDENT_AMBULATORY_CARE_PROVIDER_SITE_OTHER): Payer: Medicare Other | Admitting: Cardiology

## 2013-09-13 ENCOUNTER — Encounter: Payer: Self-pay | Admitting: Cardiology

## 2013-09-13 VITALS — BP 118/54 | HR 40 | Ht 70.0 in | Wt 164.0 lb

## 2013-09-13 DIAGNOSIS — R001 Bradycardia, unspecified: Secondary | ICD-10-CM | POA: Insufficient documentation

## 2013-09-13 DIAGNOSIS — I4891 Unspecified atrial fibrillation: Secondary | ICD-10-CM

## 2013-09-13 NOTE — Patient Instructions (Signed)
Your physician wants you to follow-up in: 6 months with Dr. Anne Fu. You will receive a reminder letter in the mail two months in advance. If you don't receive a letter, please call our office to schedule the follow-up appointment.  Your physician has recommended you make the following change in your medication:  1. Stop Clonidine.   2. Continue all other medications.

## 2013-09-13 NOTE — Progress Notes (Signed)
[address] [place], [place]  [address] Phone: [phone] Fax:  [phone]  Date:  09/13/2013   ID:  DELANTE KARAPETYAN, DOB 1921-06-06, MRN [id number]  PCP:  Mickie Hillier, MD   History of Present Illness: GAR GLANCE is a 79 y.o. male with chronic atrial fibrillation, chronic anticoagulation, metastatic prostate cancer here for followup.  His heart rate on 09/13/13 was 40 on EKG. I decided to stop his clonidine at that time of point one twice a day. He is on no other AV nodal blocking agents.  He occasionally will have leg cramping. Denies any syncope, denies any shortness of breath, no chest pain.   Wt Readings from Last 3 Encounters:  09/13/13 164 lb (74.39 kg)  07/06/13 163 lb 12.8 oz (74.299 kg)  05/04/13 161 lb 4.8 oz (73.165 kg)     Past Medical History  Diagnosis Date  . Hypertension   . History of BPH     reported by patient  . Prostate cancer   . Chronic anticoagulation   . PAF (paroxysmal atrial fibrillation)   . Coronary artery disease CARDIOLOGIST- DR Anne Fu LAST VISIT 2 MON AGO-- WILL REQUEST NOTE, STRESS TEST AND ECHO  . History of gout STABLE  . Arthritis   . Nocturia   . Frequency of urination   . RBBB (right bundle branch block)   . History of pneumonia 2009  . Osteoarthrosis, unspecified whether generalized or localized, shoulder region   . Other specified cardiac dysrhythmias(427.89)   . History of kidney stones   . Asthma     AS CHILD ONLY  . H/O hiatal hernia   . GERD (gastroesophageal reflux disease)   . History of hepatitis 1965    no residual problems  . Diverticulosis   . Hematuria     Past Surgical History  Procedure Laterality Date  . Cataract extraction w/ intraocular lens  implant, bilateral    . Cardiovascular stress test  09-10-2007--  PER DR Linnet Bottari NOTE    LOW RISK/ NO ISCHEMIA  . Transthoracic echocardiogram  11-08-2008    EF 65-70%/  NORMAL LVSF/ MILD AORTIC STENOSIS/ MOD. LEFT ATRIAL  ENLARGEMENT  . Cardioversion  X2   2001  . Cryoablation  01/24/2012    Procedure: CRYO ABLATION PROSTATE;  Surgeon: Kathi Ludwig, MD;  Location: [facility]  Service: Urology;  Laterality: N/A;  . Transurethral resection of prostate      "FROZE PART OF THE PROSTATE"  . Transurethral resection of bladder tumor  12/24/2012    Procedure: TRANSURETHRAL RESECTION OF BLADDER TUMOR (TURBT);  Surgeon: Kathi Ludwig, MD;  Location: [facility];  Service: Urology;  Laterality: N/A;    Current Outpatient Prescriptions  Medication Sig Dispense Refill  . allopurinol (ZYLOPRIM) 300 MG tablet Take 300 mg by mouth daily. For gout      . aspirin 81 MG tablet Take 81 mg by mouth daily.      . cephALEXin (KEFLEX) 500 MG capsule Take 1 capsule (500 mg total) by mouth 2 (two) times daily.  10 capsule  0  . cloNIDine (CATAPRES) 0.1 MG tablet Take 0.1 mg by mouth 2 (two) times daily.       . hydrochlorothiazide (HYDRODIURIL) 25 MG tablet Take 25 mg by mouth daily before breakfast.       . lisinopril (PRINIVIL,ZESTRIL) 40 MG tablet Take 20 mg by mouth 2 (two) times daily. Takes 1/2      .  losartan (COZAAR) 50 MG tablet Take 50 mg by mouth 2 (two) times daily.      . mirabegron ER (MYRBETRIQ) 50 MG TB24 Take 50 mg by mouth daily before breakfast.      . Multiple Vitamins-Minerals (ICAPS PO) Take 2 capsules by mouth daily.       Marland Kitchen oxybutynin (DITROPAN) 5 MG tablet Take 1 tablet (5 mg total) by mouth every 8 (eight) hours as needed.  30 tablet  1  . oxyCODONE-acetaminophen (ROXICET) 5-325 MG per tablet Take 1 tablet by mouth every 4 (four) hours as needed for pain.  30 tablet  0  . URELLE (URELLE/URISED) 81 MG TABS Take 1 tablet (81 mg total) by mouth 3 (three) times daily.  30 each  2  . warfarin (COUMADIN) 2 MG tablet Take 2-3 mg by mouth daily before breakfast. Takes 1and 1/2   To equal 3 mgtablet every day except Monday and Friday he takes 1 tablet =2 mg       No current  facility-administered medications for this visit.    Allergies:    Allergies  Allergen Reactions  . Amlodipine     Ankle swelling    Social History:  The patient  reports that he quit smoking about 62 years ago. His smoking use included Cigarettes. He smoked 0.00 packs per day. His smokeless tobacco use includes Snuff. He reports that he drinks alcohol. He reports that he does not use illicit drugs.   ROS: Chest pain, no falls, no syncope, no bleeding Please see the history of present illness.       PHYSICAL EXAM: VS:  BP 118/54  Pulse 40  Ht 5\' 10"  (1.778 m)  Wt 164 lb (74.39 kg)  BMI 23.53 kg/m2 Well nourished, well developed, in no acute distress, mildly disheveled HEENT: normal, lengthy eyebrows. Neck: no JVD Cardiac:  Bradycardic, irregular, heart rate currently in the 50; no murmur Lungs:  clear to auscultation bilaterally, no wheezing, rhonchi or rales Abd: soft, nontender, no hepatomegaly Ext: no edema Skin: warm and dry Neuro: no focal abnormalities noted  EKG:  Atrial fibrillation, 40, incomplete right bundle branch block, nonspecific ST-T wave changes, PVC   ASSESSMENT AND PLAN:  1. Persistent atrial fibrillation-bradycardia noted. I will discontinue his clonidine point one twice a day. He's not having any symptoms from his bradycardia. Pacemaker may be warranted if syncope occurs. We'll have to wait risk versus benefit especially in light of his metastatic prostate cancer. 2. Chronic anticoagulation-no changes. No bleeding. Minor excoriations noted. 3. Hypertension-currently well controlled. No changes made. 4. Bradycardia-  no symptoms. As above. Stopping clonidine.  Signed, Donato Schultz, MD [facility]  09/13/2013 3:27 PM

## 2013-09-24 ENCOUNTER — Other Ambulatory Visit: Payer: Self-pay | Admitting: Cardiology

## 2013-09-27 ENCOUNTER — Ambulatory Visit: Payer: Medicare Other | Admitting: Cardiology

## 2013-10-06 ENCOUNTER — Telehealth: Payer: Self-pay | Admitting: Oncology

## 2013-10-06 ENCOUNTER — Ambulatory Visit (HOSPITAL_BASED_OUTPATIENT_CLINIC_OR_DEPARTMENT_OTHER): Payer: Medicare Other | Admitting: Oncology

## 2013-10-06 ENCOUNTER — Other Ambulatory Visit (HOSPITAL_BASED_OUTPATIENT_CLINIC_OR_DEPARTMENT_OTHER): Payer: Medicare Other | Admitting: Lab

## 2013-10-06 VITALS — BP 109/46 | HR 54 | Temp 97.4°F | Resp 17 | Ht 70.0 in | Wt 165.0 lb

## 2013-10-06 DIAGNOSIS — C679 Malignant neoplasm of bladder, unspecified: Secondary | ICD-10-CM

## 2013-10-06 DIAGNOSIS — R599 Enlarged lymph nodes, unspecified: Secondary | ICD-10-CM

## 2013-10-06 DIAGNOSIS — C61 Malignant neoplasm of prostate: Secondary | ICD-10-CM

## 2013-10-06 DIAGNOSIS — M899 Disorder of bone, unspecified: Secondary | ICD-10-CM

## 2013-10-06 LAB — COMPREHENSIVE METABOLIC PANEL (CC13)
Albumin: 3.8 g/dL (ref 3.5–5.0)
Alkaline Phosphatase: 91 U/L (ref 40–150)
Anion Gap: 8 mEq/L (ref 3–11)
BUN: 39.7 mg/dL — ABNORMAL HIGH (ref 7.0–26.0)
CO2: 23 mEq/L (ref 22–29)
Calcium: 9.8 mg/dL (ref 8.4–10.4)
Chloride: 112 mEq/L — ABNORMAL HIGH (ref 98–109)
Creatinine: 1.7 mg/dL — ABNORMAL HIGH (ref 0.7–1.3)
Glucose: 144 mg/dl — ABNORMAL HIGH (ref 70–140)
Potassium: 4.4 mEq/L (ref 3.5–5.1)

## 2013-10-06 LAB — CBC WITH DIFFERENTIAL/PLATELET
Basophils Absolute: 0 10*3/uL (ref 0.0–0.1)
Eosinophils Absolute: 0.1 10*3/uL (ref 0.0–0.5)
HCT: 37.6 % — ABNORMAL LOW (ref 38.4–49.9)
HGB: 12.2 g/dL — ABNORMAL LOW (ref 13.0–17.1)
MCH: 30.4 pg (ref 27.2–33.4)
MCHC: 32.4 g/dL (ref 32.0–36.0)
MONO#: 0.6 10*3/uL (ref 0.1–0.9)
NEUT#: 3.8 10*3/uL (ref 1.5–6.5)
NEUT%: 55.5 % (ref 39.0–75.0)
WBC: 6.8 10*3/uL (ref 4.0–10.3)
lymph#: 2.3 10*3/uL (ref 0.9–3.3)

## 2013-10-06 NOTE — Progress Notes (Signed)
Hematology and Oncology Follow Up Visit  Terry [name] [id number] November 20, 1920 79 y.o. 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 A PET CT scan showed retroperitoneal, pelvic adenopathy and possible L4 bone lesion.   Prior Therapy: He is status post TURBT on February of 2014 with the pathology showing well-differentiated adenocarcinoma tumor involving the muscularis propria.  Current therapy: Supportive care only he declined chemotherapy for the time being.  Interim History:  Mr. Joles presents today for a followup visit. He is a very nice man I saw in April of 2014 with advanced adenocarcinoma of the bladder. He has metastatic disease to the pelvic adenopathy and possible L4 lesion. We have discussed risks and benefits of chemotherapy he elected to pursue supportive care only given his age and lack of symptomatology. Since his last visit she's continue be asymptomatic. Is not reporting any abdominal pain or hematuria. Is not reporting any back pain or deterioration in his performance status or quality of life. Is continued to be active with an excellent appetite and weight continue to be stable. He still refuses chemotherapy. He has not reported any changes in his clinical status in the last 6 months.  Medications: I have reviewed the patient's current medications.  Current Outpatient Prescriptions  Medication Sig Dispense Refill  . allopurinol (ZYLOPRIM) 300 MG tablet Take 300 mg by mouth daily. For gout      . aspirin 81 MG tablet Take 81 mg by mouth daily.      . cephALEXin (KEFLEX) 500 MG capsule Take 1 capsule (500 mg total) by mouth 2 (two) times daily.  10 capsule  0  . hydrochlorothiazide (HYDRODIURIL) 25 MG tablet Take 25 mg by mouth daily before breakfast.       . lisinopril (PRINIVIL,ZESTRIL) 40 MG tablet Take 20 mg by mouth 2 (two) times daily. Takes 1/2      . losartan (COZAAR) 50  MG tablet Take 50 mg by mouth 2 (two) times daily.      . mirabegron ER (MYRBETRIQ) 50 MG TB24 Take 50 mg by mouth daily before breakfast.      . Multiple Vitamins-Minerals (ICAPS PO) Take 2 capsules by mouth daily.       Marland Kitchen oxybutynin (DITROPAN) 5 MG tablet Take 1 tablet (5 mg total) by mouth every 8 (eight) hours as needed.  30 tablet  1  . oxyCODONE-acetaminophen (ROXICET) 5-325 MG per tablet Take 1 tablet by mouth every 4 (four) hours as needed for pain.  30 tablet  0  . URELLE (URELLE/URISED) 81 MG TABS Take 1 tablet (81 mg total) by mouth 3 (three) times daily.  30 each  2  . warfarin (COUMADIN) 2 MG tablet TAKE 1&1/2 TABLETS DAILY EXCEPT TAKE 1 TABLET ON THURSDAY AND SUNDAY.  45 tablet  5   No current facility-administered medications for this visit.     Allergies:  Allergies  Allergen Reactions  . Amlodipine     Ankle swelling    Past Medical History, Surgical history, Social history, and Family History were reviewed and updated.  Review of Systems:  Remaining ROS negative. Physical Exam: Blood pressure 109/46, pulse 54, temperature 97.4 F (36.3 C), temperature source Oral, resp. rate 17, height 5\' 10"  (1.778 m), weight 165 lb (74.844 kg). ECOG: 1 General appearance: alert Head: Normocephalic, without obvious abnormality, atraumatic Neck: no adenopathy, no carotid bruit, no JVD, supple, symmetrical, trachea midline and thyroid not enlarged,  symmetric, no tenderness/mass/nodules Lymph nodes: Cervical, supraclavicular, and axillary nodes normal. Heart:regular rate and rhythm, S1, S2 normal, no murmur, click, rub or gallop Lung:chest clear, no wheezing, rales, normal symmetric air entry Abdomin: soft, non-tender, without masses or organomegaly EXT:no erythema, induration, or nodules   Lab Results: Lab Results  Component Value Date   WBC 6.8 10/06/2013   HGB 12.2* 10/06/2013   HCT 37.6* 10/06/2013   MCV 93.7 10/06/2013   PLT 226 10/06/2013     Chemistry       Component Value Date/Time   NA 144 07/06/2013 0902   NA 141 12/15/2012 1200   K 5.0 07/06/2013 0902   K 4.6 12/15/2012 1200   CL 114* 05/04/2013 0858   CL 107 12/15/2012 1200   CO2 22 07/06/2013 0902   CO2 24 12/15/2012 1200   BUN 32.8* 07/06/2013 0902   BUN 30* 12/15/2012 1200   CREATININE 1.4* 07/06/2013 0902   CREATININE 1.41* 12/15/2012 1200      Component Value Date/Time   CALCIUM 9.1 07/06/2013 0902   CALCIUM 9.2 12/15/2012 1200   ALKPHOS 95 07/06/2013 0902   AST 18 07/06/2013 0902   ALT 13 07/06/2013 0902   BILITOT 0.51 07/06/2013 0902       Impression and Plan:  79 year old gentleman with the following issues:  1. Stage IV adenocarcinoma of the bladder: I reiterated the risks and benefits of systemic chemotherapy which would be palliative in nature but he continue to prefer supportive care only. He is continued to be asymptomatic and has reasonable quality of life which she does not we'll wishe to change.  We will continue supportive care for the time being.  2. Bony disease: He has very limited disease at L4 and he is asymptomatic from it. He develops back pain will obtain an MRI and address that area.  3. Hematuria: This is resolved and his hemoglobin is fairly stable.  4. History of prostate cancer: Diagnosed in 2013 Gleason score 3+3 equals 6 he is status post cryoablation his PSA is under control and this is really a nonissue at this point.  [facility], MD 11/19/20149:37 AM

## 2013-10-06 NOTE — Telephone Encounter (Signed)
, °

## 2013-10-18 ENCOUNTER — Ambulatory Visit (INDEPENDENT_AMBULATORY_CARE_PROVIDER_SITE_OTHER): Payer: Medicare Other | Admitting: Pharmacist

## 2013-10-18 DIAGNOSIS — I4891 Unspecified atrial fibrillation: Secondary | ICD-10-CM

## 2013-12-01 ENCOUNTER — Ambulatory Visit (INDEPENDENT_AMBULATORY_CARE_PROVIDER_SITE_OTHER): Payer: Medicare Other | Admitting: *Deleted

## 2013-12-01 DIAGNOSIS — I4891 Unspecified atrial fibrillation: Secondary | ICD-10-CM

## 2013-12-01 LAB — POCT INR: INR: 3

## 2013-12-29 ENCOUNTER — Ambulatory Visit (HOSPITAL_BASED_OUTPATIENT_CLINIC_OR_DEPARTMENT_OTHER): Payer: Medicare Other | Admitting: Oncology

## 2013-12-29 ENCOUNTER — Telehealth: Payer: Self-pay | Admitting: Oncology

## 2013-12-29 ENCOUNTER — Other Ambulatory Visit (HOSPITAL_BASED_OUTPATIENT_CLINIC_OR_DEPARTMENT_OTHER): Payer: Medicare Other

## 2013-12-29 VITALS — BP 126/34 | HR 55 | Temp 97.6°F | Resp 18 | Ht 70.0 in | Wt 162.7 lb

## 2013-12-29 DIAGNOSIS — C61 Malignant neoplasm of prostate: Secondary | ICD-10-CM

## 2013-12-29 DIAGNOSIS — C679 Malignant neoplasm of bladder, unspecified: Secondary | ICD-10-CM

## 2013-12-29 DIAGNOSIS — C7952 Secondary malignant neoplasm of bone marrow: Secondary | ICD-10-CM

## 2013-12-29 DIAGNOSIS — C7951 Secondary malignant neoplasm of bone: Secondary | ICD-10-CM

## 2013-12-29 LAB — CBC WITH DIFFERENTIAL/PLATELET
BASO%: 0.4 % (ref 0.0–2.0)
Basophils Absolute: 0 10*3/uL (ref 0.0–0.1)
EOS%: 1.4 % (ref 0.0–7.0)
Eosinophils Absolute: 0.1 10*3/uL (ref 0.0–0.5)
HCT: 33.7 % — ABNORMAL LOW (ref 38.4–49.9)
HGB: 11 g/dL — ABNORMAL LOW (ref 13.0–17.1)
LYMPH%: 25.4 % (ref 14.0–49.0)
MCH: 31.5 pg (ref 27.2–33.4)
MCHC: 32.7 g/dL (ref 32.0–36.0)
MCV: 96.1 fL (ref 79.3–98.0)
MONO#: 0.5 10*3/uL (ref 0.1–0.9)
MONO%: 9.5 % (ref 0.0–14.0)
NEUT%: 63.3 % (ref 39.0–75.0)
NEUTROS ABS: 3.3 10*3/uL (ref 1.5–6.5)
PLATELETS: 203 10*3/uL (ref 140–400)
RBC: 3.51 10*6/uL — ABNORMAL LOW (ref 4.20–5.82)
RDW: 13.9 % (ref 11.0–14.6)
WBC: 5.2 10*3/uL (ref 4.0–10.3)
lymph#: 1.3 10*3/uL (ref 0.9–3.3)

## 2013-12-29 LAB — COMPREHENSIVE METABOLIC PANEL (CC13)
ALBUMIN: 3.7 g/dL (ref 3.5–5.0)
ALK PHOS: 114 U/L (ref 40–150)
ALT: 7 U/L (ref 0–55)
AST: 14 U/L (ref 5–34)
Anion Gap: 8 mEq/L (ref 3–11)
BUN: 40.6 mg/dL — ABNORMAL HIGH (ref 7.0–26.0)
CALCIUM: 8.9 mg/dL (ref 8.4–10.4)
CHLORIDE: 115 meq/L — AB (ref 98–109)
CO2: 20 mEq/L — ABNORMAL LOW (ref 22–29)
Creatinine: 2.1 mg/dL — ABNORMAL HIGH (ref 0.7–1.3)
GLUCOSE: 135 mg/dL (ref 70–140)
POTASSIUM: 5.3 meq/L — AB (ref 3.5–5.1)
Sodium: 143 mEq/L (ref 136–145)
Total Bilirubin: 0.48 mg/dL (ref 0.20–1.20)
Total Protein: 5.7 g/dL — ABNORMAL LOW (ref 6.4–8.3)

## 2013-12-29 NOTE — Progress Notes (Signed)
Hematology and Oncology Follow Up Visit  Terry [name] [id number] [birth year] 80 y.o. 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 A PET CT scan showed retroperitoneal, pelvic adenopathy and possible L4 bone lesion.   Prior Therapy: He is status post TURBT on February of 2014 with the pathology showing well-differentiated adenocarcinoma tumor involving the muscularis propria.  Current therapy: Supportive care only he declined chemotherapy for the time being.  Interim History:  Terry [name] presents today for a followup visit. He is a very nice man I saw in April of 2014 with advanced adenocarcinoma of the bladder. He has metastatic disease to the pelvic adenopathy and possible L4 lesion. We have discussed risks and benefits of chemotherapy he elected to pursue supportive care only given his age and lack of symptomatology. Since his last visit she's continue be asymptomatic. Is not reporting any abdominal pain. Is not reporting any back pain or deterioration in his performance status or quality of life. Is continued to be active with an excellent appetite and weight continue to be stable. He still refuses chemotherapy. He has not reported any changes in his clinical status in the last 6 months. He did have one episode of hematuria that spontaneously resolved.  Medications: I have reviewed the patient's current medications.  Current Outpatient Prescriptions  Medication Sig Dispense Refill  . allopurinol (ZYLOPRIM) 300 MG tablet Take 300 mg by mouth daily. For gout      . aspirin 81 MG tablet Take 81 mg by mouth daily.      . cephALEXin (KEFLEX) 500 MG capsule Take 1 capsule (500 mg total) by mouth 2 (two) times daily.  10 capsule  0  . hydrochlorothiazide (HYDRODIURIL) 25 MG tablet Take 25 mg by mouth daily before breakfast.       . lisinopril (PRINIVIL,ZESTRIL) 40 MG tablet Take 20 mg by mouth 2 (two)  times daily. Takes 1/2      . losartan (COZAAR) 50 MG tablet Take 50 mg by mouth 2 (two) times daily.      . mirabegron ER (MYRBETRIQ) 50 MG TB24 Take 50 mg by mouth daily before breakfast.      . Multiple Vitamins-Minerals (ICAPS PO) Take 2 capsules by mouth daily.       Marland Kitchen oxybutynin (DITROPAN) 5 MG tablet Take 1 tablet (5 mg total) by mouth every 8 (eight) hours as needed.  30 tablet  1  . oxyCODONE-acetaminophen (ROXICET) 5-325 MG per tablet Take 1 tablet by mouth every 4 (four) hours as needed for pain.  30 tablet  0  . URELLE (URELLE/URISED) 81 MG TABS Take 1 tablet (81 mg total) by mouth 3 (three) times daily.  30 each  2  . warfarin (COUMADIN) 2 MG tablet TAKE 1&1/2 TABLETS DAILY EXCEPT TAKE 1 TABLET ON THURSDAY AND SUNDAY.  45 tablet  5   No current facility-administered medications for this visit.     Allergies:  Allergies  Allergen Reactions  . Amlodipine     Ankle swelling    Past Medical History, Surgical history, Social history, and Family History were reviewed and updated.  Review of Systems:  Remaining ROS negative. Physical Exam: Blood pressure 126/34, pulse 55, temperature 97.6 F (36.4 C), temperature source Oral, resp. rate 18, height 5\' 10"  (1.778 m), weight 162 lb 11.2 oz (73.8 kg), SpO2 98.00%. ECOG: 1 General appearance: alert Head: Normocephalic, without obvious abnormality, atraumatic Neck: no adenopathy, no  carotid bruit, no JVD, supple, symmetrical, trachea midline and thyroid not enlarged, symmetric, no tenderness/mass/nodules Lymph nodes: Cervical, supraclavicular, and axillary nodes normal. Heart:regular rate and rhythm, S1, S2 normal, no murmur, click, rub or gallop Lung:chest clear, no wheezing, rales, normal symmetric air entry Abdomin: soft, non-tender, without masses or organomegaly EXT:no erythema, induration, or nodules   Lab Results: Lab Results  Component Value Date   WBC 5.2 12/29/2013   HGB 11.0* 12/29/2013   HCT 33.7* 12/29/2013    MCV 96.1 12/29/2013   PLT 203 12/29/2013     Chemistry      Component Value Date/Time   NA 143 10/06/2013 0907   NA 141 12/15/2012 1200   K 4.4 10/06/2013 0907   K 4.6 12/15/2012 1200   CL 114* 05/04/2013 0858   CL 107 12/15/2012 1200   CO2 23 10/06/2013 0907   CO2 24 12/15/2012 1200   BUN 39.7* 10/06/2013 0907   BUN 30* 12/15/2012 1200   CREATININE 1.7* 10/06/2013 0907   CREATININE 1.41* 12/15/2012 1200      Component Value Date/Time   CALCIUM 9.8 10/06/2013 0907   CALCIUM 9.2 12/15/2012 1200   ALKPHOS 91 10/06/2013 0907   AST 19 10/06/2013 0907   ALT 10 10/06/2013 0907   BILITOT 0.59 10/06/2013 0907       Impression and Plan:  80 year old gentleman with the following issues:  1. Stage IV adenocarcinoma of the bladder: I reiterated the risks and benefits of systemic chemotherapy which would be palliative in nature but he continue to prefer supportive care only. He is continued to be asymptomatic and has reasonable quality of life which she does not we'll wishe to change.  We will continue supportive care for the time being. I will evaluate his clinical status in 3 months.  2. Bony disease: He has very limited disease at L4 and he is asymptomatic from it. He develops back pain will obtain an MRI and address that area.  3. Hematuria: This is resolved and his hemoglobin is fairly stable.  4. History of prostate cancer: Diagnosed in 2013 Gleason score 3+3 equals 6 he is status post cryoablation his PSA is under control and this is really a nonissue at this point.  [facility], MD 2/11/201510:49 AM

## 2013-12-29 NOTE — Telephone Encounter (Signed)
Gave pt appt for lab and Md ion May 2015

## 2014-01-12 ENCOUNTER — Ambulatory Visit (INDEPENDENT_AMBULATORY_CARE_PROVIDER_SITE_OTHER): Payer: Medicare Other | Admitting: Pharmacist

## 2014-01-12 DIAGNOSIS — I4891 Unspecified atrial fibrillation: Secondary | ICD-10-CM

## 2014-01-12 LAB — POCT INR: INR: 4.6

## 2014-01-17 ENCOUNTER — Telehealth: Payer: Self-pay | Admitting: *Deleted

## 2014-01-17 NOTE — Telephone Encounter (Signed)
Pt states he has "almost pure blood when he 1st urinates, then it is a mix of urine and blood". States when he last saw Dr Alen Blew it was only occasional blood, but is now more frequently. Instructed patient to call urologist today and RN at [facility] will follow up with "on call" doctor as Dr Alen Blew is out of the office toady.

## 2014-01-21 ENCOUNTER — Ambulatory Visit (INDEPENDENT_AMBULATORY_CARE_PROVIDER_SITE_OTHER): Payer: Medicare Other | Admitting: Pharmacist

## 2014-01-21 DIAGNOSIS — I4891 Unspecified atrial fibrillation: Secondary | ICD-10-CM

## 2014-01-21 LAB — POCT INR: INR: 2.7

## 2014-02-02 ENCOUNTER — Telehealth: Payer: Self-pay | Admitting: Cardiology

## 2014-02-02 NOTE — Telephone Encounter (Signed)
Patient stopped warfarin this morning following 4-5 days of heavy hematuria.  Dr. Barbaraann Faster has already instructed patient to call urology to be seen.  I advised patient to remain off warfarin and to call urology as well.  He is currently scheduled to see urology in a few weeks, but he is going to call them now and try to get in this week.  Patient will call me back after seeing urology, and a decision on warfarin to be made at that time. To Dr. Marlou Porch as Juluis Rainier.

## 2014-02-02 NOTE — Telephone Encounter (Signed)
New message     For Terry [name] Pt stopped coumadin today because of blood in urine for 4 days--pt stopped medication on his own.  When do you want him to restart the medication?

## 2014-02-02 NOTE — Telephone Encounter (Signed)
Agree. Needs urologic evaluation

## 2014-02-08 ENCOUNTER — Other Ambulatory Visit: Payer: Self-pay | Admitting: Urology

## 2014-02-09 ENCOUNTER — Encounter (HOSPITAL_COMMUNITY): Payer: Self-pay | Admitting: Pharmacy Technician

## 2014-02-09 NOTE — Progress Notes (Signed)
Surgery on 02/17/14.  Preop on 02/10/14.  Need orders in EPIC.  Thank You.

## 2014-02-10 ENCOUNTER — Encounter (HOSPITAL_COMMUNITY): Payer: Self-pay

## 2014-02-10 ENCOUNTER — Encounter (HOSPITAL_COMMUNITY)
Admission: RE | Admit: 2014-02-10 | Discharge: 2014-02-10 | Disposition: A | Discharge status: Home / Self Care | Payer: Medicare Other | Source: Ambulatory Visit | Attending: Urology | Admitting: Urology

## 2014-02-10 ENCOUNTER — Ambulatory Visit (HOSPITAL_COMMUNITY)
Admission: RE | Admit: 2014-02-10 | Discharge: 2014-02-10 | Disposition: A | Discharge status: Home / Self Care | Payer: Medicare Other | Source: Ambulatory Visit | Attending: Anesthesiology | Admitting: Anesthesiology

## 2014-02-10 DIAGNOSIS — Z8551 Personal history of malignant neoplasm of bladder: Secondary | ICD-10-CM

## 2014-02-10 DIAGNOSIS — Z01812 Encounter for preprocedural laboratory examination: Secondary | ICD-10-CM | POA: Insufficient documentation

## 2014-02-10 DIAGNOSIS — I2584 Coronary atherosclerosis due to calcified coronary lesion: Secondary | ICD-10-CM | POA: Insufficient documentation

## 2014-02-10 DIAGNOSIS — I700 Atherosclerosis of aorta: Secondary | ICD-10-CM | POA: Insufficient documentation

## 2014-02-10 DIAGNOSIS — Z01818 Encounter for other preprocedural examination: Secondary | ICD-10-CM | POA: Insufficient documentation

## 2014-02-10 DIAGNOSIS — C679 Malignant neoplasm of bladder, unspecified: Secondary | ICD-10-CM | POA: Insufficient documentation

## 2014-02-10 DIAGNOSIS — J90 Pleural effusion, not elsewhere classified: Secondary | ICD-10-CM | POA: Insufficient documentation

## 2014-02-10 HISTORY — DX: Personal history of malignant neoplasm of bladder: Z85.51

## 2014-02-10 LAB — CBC
HEMATOCRIT: 31.1 % — AB (ref 39.0–52.0)
Hemoglobin: 10.5 g/dL — ABNORMAL LOW (ref 13.0–17.0)
MCH: 30.7 pg (ref 26.0–34.0)
MCHC: 33.8 g/dL (ref 30.0–36.0)
MCV: 90.9 fL (ref 78.0–100.0)
Platelets: 188 10*3/uL (ref 150–400)
RBC: 3.42 MIL/uL — ABNORMAL LOW (ref 4.22–5.81)
RDW: 13.8 % (ref 11.5–15.5)
WBC: 5.7 10*3/uL (ref 4.0–10.5)

## 2014-02-10 LAB — BASIC METABOLIC PANEL
BUN: 41 mg/dL — AB (ref 6–23)
CO2: 19 meq/L (ref 19–32)
Calcium: 9.4 mg/dL (ref 8.4–10.5)
Chloride: 109 mEq/L (ref 96–112)
Creatinine, Ser: 1.9 mg/dL — ABNORMAL HIGH (ref 0.50–1.35)
GFR calc Af Amer: 34 mL/min — ABNORMAL LOW (ref >90)
GFR calc non Af Amer: 29 mL/min — ABNORMAL LOW (ref >90)
Glucose, Bld: 91 mg/dL (ref 70–99)
Potassium: 5.1 mEq/L (ref 3.7–5.3)
Sodium: 141 mEq/L (ref 137–147)

## 2014-02-10 NOTE — Pre-Procedure Instructions (Addendum)
02-10-13 Ekg 10'14. CXR done today. 02-10-14 1630 Labs/ CXR results note sent to Dr. Gaynelle Arabian are viewable in [facility]. W. Cassandra Harbold,RN.

## 2014-02-10 NOTE — Progress Notes (Signed)
02-10-14 labs viewable in Epic, CXR result viewable done today.

## 2014-02-10 NOTE — Patient Instructions (Addendum)
[address]  02/10/2014   Your procedure is scheduled on: 4-2  -2015  Report to [facility] at     0800   AM .  Call this number if you have problems the morning of surgery: [phone]  Or Presurgical Testing [phone](Cecile Gillispie) For Living Will and/or Health Care Power Attorney Forms: please provide copy for your medical record,may bring AM of surgery(Forms should be already notarized -we do not provide this service).(02-10-14 Bring copy of Living will documents for records).     Do not eat food:After Midnight.    Take these medicines the morning of surgery with A SIP OF WATER: Allopurinol. Clonidine. Use Refresh eye drops/may bring. Stop Coumadin and Aspirin as per Doctor instructions.   Do not wear jewelry, make-up or nail polish.  Do not wear lotions, powders, or perfumes. You may wear deodorant.  Do not shave 48 hours(2 days) prior to first CHG shower(legs and under arms).(Shaving face and neck okay.)  Do not bring valuables to the hospital.(Hospital is not responsible for lost valuables).  Contacts, dentures or removable bridgework, body piercing, hair pins may not be worn into surgery.  Leave suitcase in the car. After surgery it may be brought to your room.  For patients admitted to the hospital, checkout time is 11:00 AM the day of discharge.(Restricted visitors-Any Persons displaying flu-like symptoms or illness).    Patients discharged the day of surgery will not be allowed to drive home. Must have responsible person with you x 24 hours once discharged.  Name and phone number of your driver: "[name] son [phone] cell  Special Instructions: CHG(Chlorhedine 4%-"Hibiclens","Betasept","Aplicare") Shower Use Special Wash: see special instructions.(avoid face and genitals)    Failure to follow these instructions may result in Cancellation of your surgery.   Patient signature_______________________________________________________

## 2014-02-16 ENCOUNTER — Telehealth: Payer: Self-pay | Admitting: Medical Oncology

## 2014-02-16 NOTE — Telephone Encounter (Signed)
New message  Pt called states that he is urinating blood.. requests a sooner appt ... Offered PA or NP pt declined.. Pt also requests cardiac clearance  to approve the operation with Dr. Gaynelle Arabian [phone].

## 2014-02-16 NOTE — Telephone Encounter (Signed)
Patient called stating that his bladder surgery with Dr. Hartley Barefoot was cancelled due to " needing Dr Hazeline Junker ok that I can have surgery." Call to Dr Wonda Amis office to clarify patient's call and spoke with Maudie Mercury, his nurse. Per Maudie Mercury, surgery cancelled d/t patients bleeding resolving and routine Chest xray revealed possible safety concerns for patient to go to surgery. Reviewed with patient information and informed him and Maudie Mercury, that per Dr Alen Blew, patient should have surgery if bleeding continues regardless of chest xray. Kim states, spoke with pt this morning and he is aware that for now surgery is on hold and patient is to f/u with his scheduled appts with Dr. Alen Blew and Dr. Marlou Porch till further notice and possible adjustment to his coumadin medication. Maudie Mercury states will call patient again to clarify.

## 2014-02-17 ENCOUNTER — Inpatient Hospital Stay (HOSPITAL_COMMUNITY): Admission: RE | Admit: 2014-02-17 | Payer: Medicare Other | Source: Ambulatory Visit | Admitting: Urology

## 2014-02-17 ENCOUNTER — Encounter (HOSPITAL_COMMUNITY): Admission: RE | Payer: Self-pay | Source: Ambulatory Visit

## 2014-02-17 SURGERY — TURBT (TRANSURETHRAL RESECTION OF BLADDER TUMOR)
Anesthesia: General

## 2014-02-18 NOTE — Telephone Encounter (Signed)
Since patient has stopped Warfarin, no bleeding , scheduled patient with a sooner appointment.

## 2014-03-02 ENCOUNTER — Encounter: Payer: Self-pay | Admitting: Cardiology

## 2014-03-02 ENCOUNTER — Ambulatory Visit (INDEPENDENT_AMBULATORY_CARE_PROVIDER_SITE_OTHER): Payer: Medicare Other | Admitting: Cardiology

## 2014-03-02 VITALS — BP 114/46 | HR 44 | Ht 70.5 in | Wt 161.0 lb

## 2014-03-02 DIAGNOSIS — I10 Essential (primary) hypertension: Secondary | ICD-10-CM

## 2014-03-02 DIAGNOSIS — Z7901 Long term (current) use of anticoagulants: Secondary | ICD-10-CM

## 2014-03-02 DIAGNOSIS — I498 Other specified cardiac arrhythmias: Secondary | ICD-10-CM

## 2014-03-02 DIAGNOSIS — R001 Bradycardia, unspecified: Secondary | ICD-10-CM

## 2014-03-02 DIAGNOSIS — Z01810 Encounter for preprocedural cardiovascular examination: Secondary | ICD-10-CM

## 2014-03-02 DIAGNOSIS — I4891 Unspecified atrial fibrillation: Secondary | ICD-10-CM

## 2014-03-02 NOTE — Progress Notes (Signed)
[facility]. [address] [place], [place]  [address] Phone: [phone] Fax:  [phone]  Date:  03/02/2014   ID:  Terry [name], DOB 1921/05/22, MRN [id number]  PCP:  Gennette Pac, MD   History of Present Illness: Terry [name] is a 80 y.o. male with chronic atrial fibrillation, chronic anticoagulation, metastatic prostate/bladder cancer here for followup.  Had gross hematuria. Saw his urologist and had cystoscopy which revealed multiple tumors as possible bleeding sources. He was scheduled to have cauterization but canceled to discuss with me.  After he himself stopped his warfarin, his hematuria resolved.   His heart rate on 09/13/13 was 40bpm on EKG. I decided to stop his clonidine at that time of point one twice a day. He is on no other AV nodal blocking agents.  He occasionally will have leg cramping. Denies any syncope, denies any shortness of breath, no chest pain.   Wt Readings from Last 3 Encounters:  03/02/14 161 lb (73.029 kg)  02/10/14 164 lb 4 oz (74.503 kg)  12/29/13 162 lb 11.2 oz (73.8 kg)     Past Medical History  Diagnosis Date  . Hypertension   . History of BPH     reported by patient  . Chronic anticoagulation   . PAF (paroxysmal atrial fibrillation)   . Coronary artery disease CARDIOLOGIST- DR Marlou Porch LAST VISIT 2 MON AGO-- WILL REQUEST NOTE, STRESS TEST AND ECHO  . History of gout STABLE  . Arthritis   . Nocturia   . Frequency of urination   . RBBB (right bundle branch block)   . History of pneumonia 2009  . Osteoarthrosis, unspecified whether generalized or localized, shoulder region   . Other specified cardiac dysrhythmias(427.89)   . History of kidney stones   . Asthma     AS CHILD ONLY  . H/O hiatal hernia   . GERD (gastroesophageal reflux disease)   . History of hepatitis 1965    no residual problems  . Diverticulosis   . Hematuria   . Prostate cancer   . History of bladder cancer 02-10-14    surgery planned     Past Surgical History  Procedure Laterality Date  . Cataract extraction w/ intraocular lens  implant, bilateral    . Cardiovascular stress test  09-10-2007--  PER DR Edye Hainline NOTE    LOW RISK/ NO ISCHEMIA  . Transthoracic echocardiogram  11-08-2008    EF 65-70%/  NORMAL LVSF/ MILD AORTIC STENOSIS/ MOD. LEFT ATRIAL ENLARGEMENT  . Cardioversion  X2   2001  . Cryoablation  01/24/2012    Procedure: CRYO ABLATION PROSTATE;  Surgeon: Ailene Rud, MD;  Location: [facility];  Service: Urology;  Laterality: N/A;  . Transurethral resection of prostate      "FROZE PART OF THE PROSTATE"  . Transurethral resection of bladder tumor  12/24/2012    Procedure: TRANSURETHRAL RESECTION OF BLADDER TUMOR (TURBT);  Surgeon: Ailene Rud, MD;  Location: [facility];  Service: Urology;  Laterality: N/A;  . Tonsillectomy      child    Current Outpatient Prescriptions  Medication Sig Dispense Refill  . allopurinol (ZYLOPRIM) 300 MG tablet Take 300 mg by mouth daily. For gout      . aspirin 81 MG chewable tablet Chew 81 mg by mouth daily. Has stopped      . cloNIDine (CATAPRES) 0.1 MG tablet Take 0.1-0.2 mg by mouth 2 (two) times daily. 2 in the morning and 1  at night      . hydrochlorothiazide (HYDRODIURIL) 25 MG tablet Take 25 mg by mouth daily before breakfast.       . lisinopril (PRINIVIL,ZESTRIL) 40 MG tablet Take 20 mg by mouth 2 (two) times daily. Takes 1/2      . losartan (COZAAR) 50 MG tablet Take 50 mg by mouth 2 (two) times daily.      . mirabegron ER (MYRBETRIQ) 50 MG TB24 Take 50 mg by mouth daily before breakfast.      . Multiple Vitamins-Minerals (ICAPS PO) Take 1 capsule by mouth 2 (two) times daily.       . Polyvinyl Alcohol-Povidone (REFRESH OP) Apply 1 drop to eye as needed (dry eyes).      . warfarin (COUMADIN) 2 MG tablet Take 2-3.5 mg by mouth daily. 1.5 tablets on Monday Wednesday and Fridays. Takes 1 tablet on all other days       No current  facility-administered medications for this visit.    Allergies:    Allergies  Allergen Reactions  . Amlodipine     Ankle swelling    Social History:  The patient  reports that he quit smoking about 63 years ago. His smoking use included Cigarettes. He smoked 0.00 packs per day. His smokeless tobacco use includes Snuff. He reports that he drinks alcohol. He reports that he does not use illicit drugs.   ROS: Chest pain, no falls, no syncope, no bleeding Please see the history of present illness.       PHYSICAL EXAM: VS:  BP 114/46  Pulse 44  Ht 5' 10.5" (1.791 m)  Wt 161 lb (73.029 kg)  BMI 22.77 kg/m2 Well nourished, well developed, in no acute distress, mildly disheveled HEENT: normal, lengthy eyebrows. Neck: no JVD Cardiac:  Bradycardic, irregular, heart rate currently in the 50; no murmur Lungs:  clear to auscultation bilaterally, no wheezing, rhonchi or rales Abd: soft, nontender, no hepatomegaly Ext: no edema Skin: warm and dry Neuro: no focal abnormalities noted  EKG:  Atrial fibrillation, 40, incomplete right bundle branch block, nonspecific ST-T wave changes, PVC   ASSESSMENT AND PLAN:  1. Cardiovascular risk for upcoming bladder surgery-I do believe he is of low to moderate risk for this low to intermediate risk procedure. I do not see any reasons to stop him from having procedure. He may proceed. Continue to hold Coumadin until after the cauterization procedure and resume at the direction of Dr. Gaynelle Arabian. Without warfarin, he understands he is at increased risk for stroke with his atrial fibrillation. He wishes to resume warfarin and I do not think that this is a bad idea if we are able to stop bleeding source. Had lengthy discussion with he and his son.  Persistent atrial fibrillation-bradycardia noted. He's not having any symptoms from his bradycardia. Pacemaker may be warranted if syncope occurs.  2. Chronic anticoagulation-Holding coumadin currently 3.  Hypertension-currently well controlled. No changes made. 4. Bradycardia-  no symptoms. As above. Stopped clonidine. See back in 6 months.   Signed, Candee Furbish, MD [facility]  03/02/2014 12:50 PM

## 2014-03-02 NOTE — Patient Instructions (Addendum)
Your physician recommends that you continue on your current medications as directed. Please refer to the Current Medication list given to you today.  Your physician wants you to follow-up in: 6 months with Dr. Marlou Porch. You will receive a reminder letter in the mail two months in advance. If you don't receive a letter, please call our office to schedule the follow-up appointment.   We will send pre-op clearance note to Dr. Gaynelle Arabian.

## 2014-03-03 ENCOUNTER — Other Ambulatory Visit: Payer: Self-pay | Admitting: Urology

## 2014-03-15 ENCOUNTER — Ambulatory Visit: Payer: Medicare Other | Admitting: Cardiology

## 2014-03-16 ENCOUNTER — Encounter: Payer: Self-pay | Admitting: Cardiology

## 2014-03-18 ENCOUNTER — Encounter: Payer: Self-pay | Admitting: Cardiology

## 2014-03-21 ENCOUNTER — Encounter: Payer: Self-pay | Admitting: Cardiology

## 2014-03-30 ENCOUNTER — Encounter: Payer: Self-pay | Admitting: Oncology

## 2014-03-30 ENCOUNTER — Other Ambulatory Visit (HOSPITAL_BASED_OUTPATIENT_CLINIC_OR_DEPARTMENT_OTHER): Payer: Medicare Other

## 2014-03-30 ENCOUNTER — Ambulatory Visit (HOSPITAL_BASED_OUTPATIENT_CLINIC_OR_DEPARTMENT_OTHER): Payer: Medicare Other | Admitting: Oncology

## 2014-03-30 ENCOUNTER — Telehealth: Payer: Self-pay | Admitting: Oncology

## 2014-03-30 VITALS — BP 122/36 | HR 52 | Temp 97.2°F | Resp 17 | Ht 70.0 in | Wt 153.6 lb

## 2014-03-30 DIAGNOSIS — C7952 Secondary malignant neoplasm of bone marrow: Secondary | ICD-10-CM

## 2014-03-30 DIAGNOSIS — R319 Hematuria, unspecified: Secondary | ICD-10-CM

## 2014-03-30 DIAGNOSIS — Z8546 Personal history of malignant neoplasm of prostate: Secondary | ICD-10-CM

## 2014-03-30 DIAGNOSIS — C679 Malignant neoplasm of bladder, unspecified: Secondary | ICD-10-CM

## 2014-03-30 DIAGNOSIS — C7951 Secondary malignant neoplasm of bone: Secondary | ICD-10-CM

## 2014-03-30 LAB — CBC WITH DIFFERENTIAL/PLATELET
BASO%: 0.1 % (ref 0.0–2.0)
Basophils Absolute: 0 10*3/uL (ref 0.0–0.1)
EOS ABS: 0.1 10*3/uL (ref 0.0–0.5)
EOS%: 1.1 % (ref 0.0–7.0)
HCT: 35.4 % — ABNORMAL LOW (ref 38.4–49.9)
HGB: 11.7 g/dL — ABNORMAL LOW (ref 13.0–17.1)
LYMPH#: 1.9 10*3/uL (ref 0.9–3.3)
LYMPH%: 26.4 % (ref 14.0–49.0)
MCH: 30 pg (ref 27.2–33.4)
MCHC: 33.1 g/dL (ref 32.0–36.0)
MCV: 90.8 fL (ref 79.3–98.0)
MONO#: 0.5 10*3/uL (ref 0.1–0.9)
MONO%: 7.4 % (ref 0.0–14.0)
NEUT%: 65 % (ref 39.0–75.0)
NEUTROS ABS: 4.7 10*3/uL (ref 1.5–6.5)
PLATELETS: 181 10*3/uL (ref 140–400)
RBC: 3.9 10*6/uL — AB (ref 4.20–5.82)
RDW: 13.8 % (ref 11.0–14.6)
WBC: 7.2 10*3/uL (ref 4.0–10.3)

## 2014-03-30 LAB — COMPREHENSIVE METABOLIC PANEL (CC13)
ALBUMIN: 3.6 g/dL (ref 3.5–5.0)
ALT: 8 U/L (ref 0–55)
ANION GAP: 10 meq/L (ref 3–11)
AST: 15 U/L (ref 5–34)
Alkaline Phosphatase: 99 U/L (ref 40–150)
BUN: 48.8 mg/dL — ABNORMAL HIGH (ref 7.0–26.0)
CHLORIDE: 117 meq/L — AB (ref 98–109)
CO2: 19 meq/L — AB (ref 22–29)
Calcium: 9.5 mg/dL (ref 8.4–10.4)
Creatinine: 2.2 mg/dL — ABNORMAL HIGH (ref 0.7–1.3)
GLUCOSE: 129 mg/dL (ref 70–140)
POTASSIUM: 5.2 meq/L — AB (ref 3.5–5.1)
SODIUM: 146 meq/L — AB (ref 136–145)
TOTAL PROTEIN: 5.9 g/dL — AB (ref 6.4–8.3)
Total Bilirubin: 0.51 mg/dL (ref 0.20–1.20)

## 2014-03-30 NOTE — Progress Notes (Signed)
Hematology and Oncology Follow Up Visit  Terry [name] [id number] [birth year] 80 y.o. 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 A PET CT scan showed retroperitoneal, pelvic adenopathy and possible L4 bone lesion.   Prior Therapy: He is status post TURBT on February of 2014 with the pathology showing well-differentiated adenocarcinoma tumor involving the muscularis propria.  Current therapy: Supportive care only he declined chemotherapy for the time being.  Interim History:  Terry [name] presents today for a followup visit. Since his last visit, he developed gross hematuria and was evaluated by Dr. Gaynelle Arabian and scheduled to have a cystoscopy. He stopped Coumadin temporarily but now is back on it. He is reporting intermittent hematuria at this time and it's not persistent. He is not reporting any pelvic pain or difficulty urination. He is not reporting any flank pain or discomfort. He Is not reporting any abdominal pain. Is not reporting any back pain or deterioration in his performance status or quality of life. Is continued to be active with an excellent appetite and weight continue to be stable. He has not reported any changes in his clinical status in the last 12 months. He has not reported any headaches or blurry vision or double vision or syncope. Did not report any chest pain or shortness of breath. Does not report any nausea or vomiting. Does not report any musculoskeletal complaints. Rest of his review of systems was unremarkable.  Medications: I have reviewed the patient's current medications.  Current Outpatient Prescriptions  Medication Sig Dispense Refill  . allopurinol (ZYLOPRIM) 300 MG tablet Take 300 mg by mouth daily. For gout      . aspirin 81 MG chewable tablet Chew 81 mg by mouth daily. Has stopped      . cloNIDine (CATAPRES) 0.1 MG tablet Take 0.1-0.2 mg by mouth 2 (two) times  daily. 2 in the morning and 1 at night      . hydrochlorothiazide (HYDRODIURIL) 25 MG tablet Take 25 mg by mouth daily before breakfast.       . HYDROcodone-acetaminophen (NORCO) 10-325 MG per tablet Take 1 tablet by mouth every 6 (six) hours as needed.      Marland Kitchen lisinopril (PRINIVIL,ZESTRIL) 40 MG tablet Take 20 mg by mouth 2 (two) times daily. Takes 1/2      . losartan (COZAAR) 50 MG tablet Take 50 mg by mouth 2 (two) times daily.      . Meth-Hyo-M Bl-Na Phos-Ph Sal (URIBEL PO) Take 1 capsule by mouth every 8 (eight) hours as needed. Take with plenty of water      . mirabegron ER (MYRBETRIQ) 50 MG TB24 Take 50 mg by mouth daily before breakfast.      . Multiple Vitamins-Minerals (ICAPS PO) Take 1 capsule by mouth 2 (two) times daily.       Marland Kitchen oxybutynin (DITROPAN) 5 MG tablet Take 5 mg by mouth every 8 (eight) hours as needed for bladder spasms.      . Polyvinyl Alcohol-Povidone (REFRESH OP) Apply 1 drop to eye as needed (dry eyes).      . warfarin (COUMADIN) 2 MG tablet Take 2-3.5 mg by mouth daily. 1.5 tablets on Monday Wednesday and Fridays. Takes 1 tablet on all other days       No current facility-administered medications for this visit.     Allergies:  Allergies  Allergen Reactions  . Amlodipine     Ankle swelling  Physical Exam: Blood pressure 122/36, pulse 52, temperature 97.2 F (36.2 C), temperature source Oral, resp. rate 17, height 5\' 10"  (1.778 m), weight 153 lb 9.6 oz (69.673 kg), SpO2 100.00%. ECOG: 1 General appearance: alert awake not in any distress. Head: Normocephalic, without obvious abnormality, atraumatic Neck: no adenopathy, no carotid bruit, no JVD, supple, symmetrical, trachea midline and thyroid not enlarged, symmetric, no tenderness/mass/nodules Lymph nodes: Cervical, supraclavicular, and axillary nodes normal. Heart:regular rate and rhythm, S1, S2 normal, no murmur, click, rub or gallop Lung:chest clear, no wheezing, rales, normal symmetric air  entry Abdomin: soft, non-tender, without masses or organomegaly EXT:no erythema, induration, or nodules   Lab Results: Lab Results  Component Value Date   WBC 7.2 03/30/2014   HGB 11.7* 03/30/2014   HCT 35.4* 03/30/2014   MCV 90.8 03/30/2014   PLT 181 03/30/2014     Chemistry      Component Value Date/Time   NA 141 02/10/2014 1515   NA 143 12/29/2013 1012   K 5.1 02/10/2014 1515   K 5.3* 12/29/2013 1012   CL 109 02/10/2014 1515   CL 114* 05/04/2013 0858   CO2 19 02/10/2014 1515   CO2 20* 12/29/2013 1012   BUN 41* 02/10/2014 1515   BUN 40.6* 12/29/2013 1012   CREATININE 1.90* 02/10/2014 1515   CREATININE 2.1* 12/29/2013 1012      Component Value Date/Time   CALCIUM 9.4 02/10/2014 1515   CALCIUM 8.9 12/29/2013 1012   ALKPHOS 114 12/29/2013 1012   AST 14 12/29/2013 1012   ALT 7 12/29/2013 1012   BILITOT 0.48 12/29/2013 1012       Impression and Plan:  80 year old gentleman with the following issues:  1. Stage IV adenocarcinoma of the bladder:  He is continued to refuse systemic chemotherapy for the time being. We can certainly consider palliative radiation therapy if he develops persistent hematuria that is not affected he to local therapy and surgical intervention.   2. Bony disease: He has very limited disease at L4 and he is asymptomatic from it. He develops back pain will obtain an MRI and address that area.  3. Hematuria: He is scheduled to have a cystoscopy in the near future. He is continuing Coumadin for the time being.  4. History of prostate cancer: Diagnosed in 2013 Gleason score 3+3 equals 6 he is status post cryoablation his PSA is under control and this is really a nonissue at this point.  Wyatt Portela, MD 5/13/201510:11 AM

## 2014-03-30 NOTE — Telephone Encounter (Signed)
gv adn prnted appt sched and avs for pt for July °

## 2014-03-31 NOTE — Patient Instructions (Signed)
Terry [name]  03/31/2014   Your procedure is scheduled on:  04/08/2014  0900am-1030am  Report to [facility] at     0700 AM.  Call this number if you have problems the morning of surgery: [phone]   Remember:   Do not eat food or drink liquids after midnight.   Take these medicines the morning of surgery with A SIP OF WATER:    Do not wear jewelry,   Do not wear lotions, powders, or perfumes.   . Men may shave face and neck.  Do not bring valuables to the hospital.  Contacts, dentures or bridgework may not be worn into surgery.  Leave suitcase in the car. After surgery it may be brought to your room.  For patients admitted to the hospital, checkout time is 11:00 AM the day of  discharge.       Please read over the following fact sheets that you were given:[facility] - Preparing for Surgery Before surgery, you can play an important role.  Because skin is not sterile, your skin needs to be as free of germs as possible.  You can reduce the number of germs on your skin by washing with CHG (chlorahexidine gluconate) soap before surgery.  CHG is an antiseptic cleaner which kills germs and bonds with the skin to continue killing germs even after washing. Please DO NOT use if you have an allergy to CHG or antibacterial soaps.  If your skin becomes reddened/irritated stop using the CHG and inform your nurse when you arrive at Short Stay. Do not shave (including legs and underarms) for at least 48 hours prior to the first CHG shower.  You may shave your face. Please follow these instructions carefully:  1.  Shower with CHG Soap the night before surgery and the  morning of Surgery.  2.  If you choose to wash your hair, wash your hair first as usual with your  normal  shampoo.  3.  After you shampoo, rinse your hair and body thoroughly to remove the  shampoo.                           4.  Use CHG as you would any other liquid soap.  You can apply chg directly  to the skin and  wash                       Gently with a scrungie or clean washcloth.  5.  Apply the CHG Soap to your body ONLY FROM THE NECK DOWN.   Do not use on open                           Wound or open sores. Avoid contact with eyes, ears mouth and genitals (private parts).                        Genitals (private parts) with your normal soap.             6.  Wash thoroughly, paying special attention to the area where your surgery  will be performed.  7.  Thoroughly rinse your body with warm water from the neck down.  8.  DO NOT shower/wash with your normal soap after using and rinsing off  the CHG Soap.  9.  Pat yourself dry with a clean towel.            10.  Wear clean pajamas.            11.  Place clean sheets on your bed the night of your first shower and do not  sleep with pets. Day of Surgery : Do not apply any lotions/deodorants the morning of surgery.  Please wear clean clothes to the hospital/surgery center.  FAILURE TO FOLLOW THESE INSTRUCTIONS MAY RESULT IN THE CANCELLATION OF YOUR SURGERY PATIENT SIGNATURE_________________________________  NURSE SIGNATURE__________________________________  ________________________________________________________________________  coughing and deep breathing exercises, leg exercises

## 2014-04-01 ENCOUNTER — Ambulatory Visit (HOSPITAL_COMMUNITY)
Admission: RE | Admit: 2014-04-01 | Discharge: 2014-04-01 | Disposition: A | Discharge status: Home / Self Care | Payer: Medicare Other | Source: Ambulatory Visit | Attending: Urology | Admitting: Urology

## 2014-04-01 ENCOUNTER — Encounter (HOSPITAL_COMMUNITY)
Admission: RE | Admit: 2014-04-01 | Discharge: 2014-04-01 | Disposition: A | Discharge status: Home / Self Care | Payer: Medicare Other | Source: Ambulatory Visit | Attending: Urology | Admitting: Urology

## 2014-04-01 ENCOUNTER — Encounter (HOSPITAL_COMMUNITY): Payer: Self-pay

## 2014-04-01 ENCOUNTER — Encounter (HOSPITAL_COMMUNITY): Payer: Self-pay | Admitting: Pharmacy Technician

## 2014-04-01 ENCOUNTER — Telehealth: Payer: Self-pay | Admitting: Cardiology

## 2014-04-01 DIAGNOSIS — Z01818 Encounter for other preprocedural examination: Secondary | ICD-10-CM | POA: Insufficient documentation

## 2014-04-01 DIAGNOSIS — I4891 Unspecified atrial fibrillation: Secondary | ICD-10-CM | POA: Insufficient documentation

## 2014-04-01 DIAGNOSIS — I10 Essential (primary) hypertension: Secondary | ICD-10-CM | POA: Insufficient documentation

## 2014-04-01 HISTORY — DX: Cardiac murmur, unspecified: R01.1

## 2014-04-01 HISTORY — DX: Inflammatory liver disease, unspecified: K75.9

## 2014-04-01 LAB — CBC
HCT: 35.3 % — ABNORMAL LOW (ref 39.0–52.0)
Hemoglobin: 11.8 g/dL — ABNORMAL LOW (ref 13.0–17.0)
MCH: 30.1 pg (ref 26.0–34.0)
MCHC: 33.4 g/dL (ref 30.0–36.0)
MCV: 90.1 fL (ref 78.0–100.0)
Platelets: 201 10*3/uL (ref 150–400)
RBC: 3.92 MIL/uL — ABNORMAL LOW (ref 4.22–5.81)
RDW: 13.5 % (ref 11.5–15.5)
WBC: 6.5 10*3/uL (ref 4.0–10.5)

## 2014-04-01 LAB — BASIC METABOLIC PANEL
BUN: 50 mg/dL — ABNORMAL HIGH (ref 6–23)
CO2: 20 mEq/L (ref 19–32)
Calcium: 9.4 mg/dL (ref 8.4–10.5)
Chloride: 109 mEq/L (ref 96–112)
Creatinine, Ser: 1.93 mg/dL — ABNORMAL HIGH (ref 0.50–1.35)
GFR calc Af Amer: 34 mL/min — ABNORMAL LOW (ref >90)
GFR calc non Af Amer: 29 mL/min — ABNORMAL LOW (ref >90)
Glucose, Bld: 101 mg/dL — ABNORMAL HIGH (ref 70–99)
Potassium: 5.2 mEq/L (ref 3.7–5.3)
Sodium: 140 mEq/L (ref 137–147)

## 2014-04-01 LAB — APTT: aPTT: 55 seconds — ABNORMAL HIGH (ref 24–37)

## 2014-04-01 LAB — PROTIME-INR
INR: 2.83 — ABNORMAL HIGH (ref 0.00–1.49)
Prothrombin Time: 28.8 seconds — ABNORMAL HIGH (ref 11.6–15.2)

## 2014-04-01 NOTE — Progress Notes (Deleted)
Faxed CBC result via EPIC to Dr Burdine(PCP).    Called office and they had received CBC result.  Spoke with Craig Staggers, RN who saw results and will let the Dr on call be aware of lab results.  Dr Pleas Koch is off today.  Dr Pleas Koch will be made aware of on Monday 04/04/2014.  They are aware patient is having right hip replacement on 04/05/2014.

## 2014-04-01 NOTE — Progress Notes (Signed)
Patient seen on preop visit for upcoming surgery on 522/2015.  Patient denies any dizziness or lightheadedness.  Blood pressure at  Time of preop visit was 114/44.  Pulse 40.  I saw in last office visit note that patient was to stop Clonidine.  Patient reported at time of preop visit he is taking Clonidine .1 mg - 2 tablets in the am and 1 in the pm.  Just wanted to let you know.  [name], son was with him at time of preop appointment.

## 2014-04-01 NOTE — Telephone Encounter (Signed)
New message    Note put into epic early today for Dr. Gillian Shields.     Regarding his heart rate & stopping pre-op medication .

## 2014-04-01 NOTE — Progress Notes (Addendum)
Called office of Dr Marlou Porch and left message for nurse and MD regarding note sent earlier today regarding heart rate at 40 and that patient had not stopped Clonidine as listed to stop in last office visit of 03/02/14.  Left message with office staff.  Dr Marlou Porch not in office today.  Also called and spoke with Kim,nurse of Dr Carolan Clines and made her aware of above regarding heart rate and note to Dr Marlou Porch.  She stated she would let Dr Gaynelle Arabian be aware.  Repeat PT/INR, PTT and Creatinine in EPIC for day of surgery.

## 2014-04-01 NOTE — Progress Notes (Signed)
PT,PTT,BMP faxed via EPIC to Dr Gaynelle Arabian.

## 2014-04-01 NOTE — Progress Notes (Signed)
Stress Test 11/26/12 EPIC  EKG 09/13/13 EPIC  Dr Marlou Porch Last office visit 03/02/14  EPIC

## 2014-04-04 NOTE — Telephone Encounter (Signed)
Left voicemail on patient phone to call the office, contacted son no answer and VM available.

## 2014-04-06 NOTE — Progress Notes (Signed)
Spoke with patient over phone and he stated he stopped Clonidine 2-3 days ago.  Patient stated that Dr Marlou Porch stated"  he could stop when he wanted to" .  Instructed patient not to take am of surgery just to get up brush his teeth, put on clean clothes and come to hospital.  Patient voiced understanding.  Patient stated he had mowed his lawn today and swept his porch and had not been having any problems.

## 2014-04-07 NOTE — Progress Notes (Signed)
Spoke with Phineas Inches, CMA at office of Dr Marlou Porch and he stated per Dr Marlou Porch patient was to stay off of Clonidine and they were going to monitor patient.  I told Phineas Inches that I had spoken with patient on 5/212/15 and that patient stated" Dr Marlou Porch told me I could take Clonidine or not take Clonidine and I stopped 2-3 days ago.  Instructed patient to take no medications am of surgery and patient voiced understanding.

## 2014-04-07 NOTE — Telephone Encounter (Signed)
Spoke with patient advised to stop clonidine and we will monitor it clinically. Patient verbalized understanding.

## 2014-04-07 NOTE — Telephone Encounter (Signed)
Spoke with Pre-op.  Advised that patient is to stop clonidine , and we will monitor clinically.

## 2014-04-08 ENCOUNTER — Inpatient Hospital Stay (HOSPITAL_COMMUNITY)
Admission: RE | Admit: 2014-04-08 | Discharge: 2014-04-09 | DRG: 670 | Disposition: A | Discharge status: Home / Self Care | Payer: Medicare Other | Source: Ambulatory Visit | Attending: Urology | Admitting: Urology

## 2014-04-08 ENCOUNTER — Encounter (HOSPITAL_COMMUNITY): Payer: Self-pay | Admitting: *Deleted

## 2014-04-08 ENCOUNTER — Encounter (HOSPITAL_COMMUNITY)
Admission: RE | Disposition: A | Discharge status: Home / Self Care | Payer: Self-pay | Source: Ambulatory Visit | Attending: Urology

## 2014-04-08 ENCOUNTER — Inpatient Hospital Stay (HOSPITAL_COMMUNITY): Payer: Medicare Other | Admitting: Anesthesiology

## 2014-04-08 ENCOUNTER — Encounter (HOSPITAL_COMMUNITY): Payer: Medicare Other | Admitting: Anesthesiology

## 2014-04-08 DIAGNOSIS — C679 Malignant neoplasm of bladder, unspecified: Principal | ICD-10-CM

## 2014-04-08 DIAGNOSIS — Z79899 Other long term (current) drug therapy: Secondary | ICD-10-CM

## 2014-04-08 DIAGNOSIS — I252 Old myocardial infarction: Secondary | ICD-10-CM

## 2014-04-08 DIAGNOSIS — J45909 Unspecified asthma, uncomplicated: Secondary | ICD-10-CM | POA: Diagnosis present

## 2014-04-08 DIAGNOSIS — C61 Malignant neoplasm of prostate: Secondary | ICD-10-CM | POA: Diagnosis present

## 2014-04-08 DIAGNOSIS — Z823 Family history of stroke: Secondary | ICD-10-CM

## 2014-04-08 DIAGNOSIS — I10 Essential (primary) hypertension: Secondary | ICD-10-CM | POA: Diagnosis present

## 2014-04-08 DIAGNOSIS — M109 Gout, unspecified: Secondary | ICD-10-CM | POA: Diagnosis present

## 2014-04-08 DIAGNOSIS — Z7901 Long term (current) use of anticoagulants: Secondary | ICD-10-CM

## 2014-04-08 DIAGNOSIS — N3289 Other specified disorders of bladder: Secondary | ICD-10-CM | POA: Diagnosis present

## 2014-04-08 DIAGNOSIS — K219 Gastro-esophageal reflux disease without esophagitis: Secondary | ICD-10-CM | POA: Diagnosis present

## 2014-04-08 DIAGNOSIS — R339 Retention of urine, unspecified: Secondary | ICD-10-CM | POA: Diagnosis present

## 2014-04-08 DIAGNOSIS — I4891 Unspecified atrial fibrillation: Secondary | ICD-10-CM | POA: Diagnosis present

## 2014-04-08 HISTORY — PX: TRANSURETHRAL RESECTION OF BLADDER TUMOR: SHX2575

## 2014-04-08 LAB — CBC
HEMATOCRIT: 40 % (ref 39.0–52.0)
Hemoglobin: 13.3 g/dL (ref 13.0–17.0)
MCH: 29.9 pg (ref 26.0–34.0)
MCHC: 33.3 g/dL (ref 30.0–36.0)
MCV: 89.9 fL (ref 78.0–100.0)
PLATELETS: 232 10*3/uL (ref 150–400)
RBC: 4.45 MIL/uL (ref 4.22–5.81)
RDW: 13.7 % (ref 11.5–15.5)
WBC: 6 10*3/uL (ref 4.0–10.5)

## 2014-04-08 LAB — APTT: aPTT: 33 seconds (ref 24–37)

## 2014-04-08 LAB — CREATININE, SERUM
Creatinine, Ser: 2.22 mg/dL — ABNORMAL HIGH (ref 0.50–1.35)
Creatinine, Ser: 1.95 mg/dL — ABNORMAL HIGH (ref 0.50–1.35)
GFR calc non Af Amer: 24 mL/min — ABNORMAL LOW (ref >90)
GFR, EST AFRICAN AMERICAN: 28 mL/min — AB (ref >90)
GFR, EST AFRICAN AMERICAN: 33 mL/min — AB (ref >90)
GFR, EST NON AFRICAN AMERICAN: 29 mL/min — AB (ref >90)

## 2014-04-08 LAB — PROTIME-INR
INR: 1.12 (ref 0.00–1.49)
PROTHROMBIN TIME: 14.2 s (ref 11.6–15.2)

## 2014-04-08 SURGERY — TURBT (TRANSURETHRAL RESECTION OF BLADDER TUMOR)
Anesthesia: General

## 2014-04-08 MED ORDER — OXYBUTYNIN CHLORIDE 5 MG PO TABS: 5.0000 mg | ORAL_TABLET | Freq: Three times a day (TID) | ORAL | Status: DC | PRN

## 2014-04-08 MED ORDER — CEFAZOLIN SODIUM-DEXTROSE 2-3 GM-% IV SOLR: 2.0000 g | INTRAVENOUS | Status: DC

## 2014-04-08 MED ORDER — FENTANYL CITRATE 0.05 MG/ML IJ SOLN
INTRAMUSCULAR | Status: DC | PRN
  Administered 2014-04-08 (×4): 25 ug via INTRAVENOUS

## 2014-04-08 MED ORDER — FENTANYL CITRATE 0.05 MG/ML IJ SOLN: 25.0000 ug | INTRAMUSCULAR | Status: DC | PRN

## 2014-04-08 MED ORDER — ALLOPURINOL 300 MG PO TABS
300.0000 mg | ORAL_TABLET | Freq: Every day | ORAL | Status: DC
  Administered 2014-04-09: 300 mg via ORAL
  Filled 2014-04-08: qty 1

## 2014-04-08 MED ORDER — TRIMETHOPRIM 100 MG PO TABS: 100.0000 mg | ORAL_TABLET | ORAL | Status: DC

## 2014-04-08 MED ORDER — DIPHENHYDRAMINE HCL 12.5 MG/5ML PO ELIX: 12.5000 mg | ORAL_SOLUTION | Freq: Four times a day (QID) | ORAL | Status: DC | PRN

## 2014-04-08 MED ORDER — BACITRACIN-NEOMYCIN-POLYMYXIN 400-5-5000 EX OINT: 1.0000 "application " | TOPICAL_OINTMENT | Freq: Three times a day (TID) | CUTANEOUS | Status: DC | PRN

## 2014-04-08 MED ORDER — CARBOXYMETHYLCELLULOSE SODIUM 1 % OP SOLN: 1.0000 [drp] | Freq: Two times a day (BID) | OPHTHALMIC | Status: DC

## 2014-04-08 MED ORDER — ICAPS PO CAPS: 1.0000 | ORAL_CAPSULE | Freq: Every morning | ORAL | Status: DC

## 2014-04-08 MED ORDER — DIPHENHYDRAMINE HCL 50 MG/ML IJ SOLN: 12.5000 mg | Freq: Four times a day (QID) | INTRAMUSCULAR | Status: DC | PRN

## 2014-04-08 MED ORDER — PROPOFOL 10 MG/ML IV BOLUS
INTRAVENOUS | Status: DC | PRN
Start: 2014-04-08 — End: 2014-04-08
  Administered 2014-04-08: 200 mg via INTRAVENOUS

## 2014-04-08 MED ORDER — OCUVITE-LUTEIN PO CAPS
1.0000 | ORAL_CAPSULE | Freq: Every day | ORAL | Status: DC
  Administered 2014-04-08 – 2014-04-09 (×2): 1 via ORAL
  Filled 2014-04-08 (×2): qty 1

## 2014-04-08 MED ORDER — PROPOFOL 10 MG/ML IV BOLUS
INTRAVENOUS | Status: AC
  Filled 2014-04-08: qty 20

## 2014-04-08 MED ORDER — FENTANYL CITRATE 0.05 MG/ML IJ SOLN
INTRAMUSCULAR | Status: AC
  Filled 2014-04-08: qty 2

## 2014-04-08 MED ORDER — SODIUM CHLORIDE 0.9 % IV SOLN
INTRAVENOUS | Status: DC
  Administered 2014-04-08: 1000 mL via INTRAVENOUS

## 2014-04-08 MED ORDER — CLONIDINE HCL 0.1 MG PO TABS
0.1000 mg | ORAL_TABLET | Freq: Two times a day (BID) | ORAL | Status: DC
Start: 2014-04-08 — End: 2014-04-08

## 2014-04-08 MED ORDER — LISINOPRIL 20 MG PO TABS: 20.0000 mg | ORAL_TABLET | Freq: Two times a day (BID) | ORAL | Status: DC

## 2014-04-08 MED ORDER — OXYCODONE-ACETAMINOPHEN 5-325 MG PO TABS: 1.0000 | ORAL_TABLET | ORAL | Status: DC | PRN

## 2014-04-08 MED ORDER — SODIUM CHLORIDE 0.9 % IR SOLN
Status: DC | PRN
  Administered 2014-04-08: 9000 mL

## 2014-04-08 MED ORDER — CLONIDINE HCL 0.2 MG PO TABS
0.2000 mg | ORAL_TABLET | Freq: Every day | ORAL | Status: DC
  Administered 2014-04-08 – 2014-04-09 (×2): 0.2 mg via ORAL
  Filled 2014-04-08 (×2): qty 1

## 2014-04-08 MED ORDER — CEFAZOLIN SODIUM-DEXTROSE 2-3 GM-% IV SOLR
2.0000 g | INTRAVENOUS | Status: AC
  Administered 2014-04-08: 2 g via INTRAVENOUS

## 2014-04-08 MED ORDER — SODIUM CHLORIDE 0.45 % IV SOLN
INTRAVENOUS | Status: DC
  Administered 2014-04-08: 12:00:00 via INTRAVENOUS

## 2014-04-08 MED ORDER — HYDROCHLOROTHIAZIDE 25 MG PO TABS
25.0000 mg | ORAL_TABLET | Freq: Every day | ORAL | Status: DC
  Administered 2014-04-09: 25 mg via ORAL
  Filled 2014-04-08 (×2): qty 1

## 2014-04-08 MED ORDER — PHENAZOPYRIDINE HCL 200 MG PO TABS: 200.0000 mg | ORAL_TABLET | Freq: Three times a day (TID) | ORAL | Status: DC | PRN

## 2014-04-08 MED ORDER — BISACODYL 10 MG RE SUPP: 10.0000 mg | Freq: Every day | RECTAL | Status: DC | PRN

## 2014-04-08 MED ORDER — MIRABEGRON ER 50 MG PO TB24
50.0000 mg | ORAL_TABLET | Freq: Every day | ORAL | Status: DC
  Administered 2014-04-09: 50 mg via ORAL
  Filled 2014-04-08 (×2): qty 1

## 2014-04-08 MED ORDER — CLONIDINE HCL 0.1 MG PO TABS
0.1000 mg | ORAL_TABLET | Freq: Every day | ORAL | Status: DC
  Filled 2014-04-08 (×2): qty 1

## 2014-04-08 MED ORDER — LIDOCAINE HCL (CARDIAC) 20 MG/ML IV SOLN
INTRAVENOUS | Status: DC | PRN
  Administered 2014-04-08: 100 mg via INTRAVENOUS

## 2014-04-08 MED ORDER — ONDANSETRON HCL 4 MG/2ML IJ SOLN: 4.0000 mg | INTRAMUSCULAR | Status: DC | PRN

## 2014-04-08 MED ORDER — CEFAZOLIN SODIUM-DEXTROSE 2-3 GM-% IV SOLR
INTRAVENOUS | Status: AC
  Filled 2014-04-08: qty 50

## 2014-04-08 MED ORDER — ONDANSETRON HCL 4 MG/2ML IJ SOLN
INTRAMUSCULAR | Status: DC | PRN
  Administered 2014-04-08: 4 mg via INTRAVENOUS

## 2014-04-08 MED ORDER — ACETAMINOPHEN 325 MG PO TABS: 650.0000 mg | ORAL_TABLET | ORAL | Status: DC | PRN

## 2014-04-08 MED ORDER — OXYBUTYNIN CHLORIDE 5 MG PO TABS
5.0000 mg | ORAL_TABLET | Freq: Three times a day (TID) | ORAL | Status: DC | PRN
  Filled 2014-04-08: qty 1

## 2014-04-08 MED ORDER — HYDROMORPHONE HCL PF 1 MG/ML IJ SOLN: 0.5000 mg | INTRAMUSCULAR | Status: DC | PRN

## 2014-04-08 MED ORDER — OCUVITE-LUTEIN PO CAPS
1.0000 | ORAL_CAPSULE | Freq: Every day | ORAL | Status: DC
  Filled 2014-04-08: qty 1

## 2014-04-08 MED ORDER — POLYVINYL ALCOHOL 1.4 % OP SOLN
1.0000 [drp] | Freq: Two times a day (BID) | OPHTHALMIC | Status: DC
  Administered 2014-04-08 – 2014-04-09 (×3): 1 [drp] via OPHTHALMIC
  Filled 2014-04-08: qty 15

## 2014-04-08 MED ORDER — LIDOCAINE HCL (CARDIAC) 20 MG/ML IV SOLN
INTRAVENOUS | Status: AC
  Filled 2014-04-08: qty 5

## 2014-04-08 MED ORDER — LACTATED RINGERS IV SOLN: INTRAVENOUS | Status: DC

## 2014-04-08 MED ORDER — SENNOSIDES-DOCUSATE SODIUM 8.6-50 MG PO TABS
2.0000 | ORAL_TABLET | Freq: Every day | ORAL | Status: DC
  Administered 2014-04-08: 2 via ORAL
  Filled 2014-04-08 (×2): qty 2

## 2014-04-08 MED ORDER — LOSARTAN POTASSIUM 50 MG PO TABS: 50.0000 mg | ORAL_TABLET | Freq: Two times a day (BID) | ORAL | Status: DC

## 2014-04-08 MED ORDER — TRAMADOL-ACETAMINOPHEN 37.5-325 MG PO TABS: 1.0000 | ORAL_TABLET | Freq: Four times a day (QID) | ORAL | Status: DC | PRN

## 2014-04-08 MED ORDER — DEXAMETHASONE SODIUM PHOSPHATE 10 MG/ML IJ SOLN
INTRAMUSCULAR | Status: DC | PRN
  Administered 2014-04-08: 10 mg via INTRAVENOUS

## 2014-04-08 MED ORDER — CIPROFLOXACIN HCL 500 MG PO TABS: 500.0000 mg | ORAL_TABLET | Freq: Two times a day (BID) | ORAL | Status: DC

## 2014-04-08 MED ORDER — CIPROFLOXACIN HCL 500 MG PO TABS
500.0000 mg | ORAL_TABLET | Freq: Every day | ORAL | Status: DC
  Administered 2014-04-08 – 2014-04-09 (×2): 500 mg via ORAL
  Filled 2014-04-08 (×3): qty 1

## 2014-04-08 SURGICAL SUPPLY — 21 items
BAG URINE DRAINAGE (UROLOGICAL SUPPLIES) ×2 IMPLANT
BAG URO CATCHER STRL LF (DRAPE) ×3 IMPLANT
CATH FOLEY 2WAY SLVR 30CC 22FR (CATHETERS) ×2 IMPLANT
DRAPE CAMERA CLOSED 9X96 (DRAPES) ×3 IMPLANT
ELECT BUTTON HF 24-28F 2 30DE (ELECTRODE) ×1 IMPLANT
ELECT LOOP MED HF 24F 12D (CUTTING LOOP) IMPLANT
ELECT LOOP MED HF 24F 12D CBL (CLIP) ×3 IMPLANT
ELECT RESECT VAPORIZE 12D CBL (ELECTRODE) ×1 IMPLANT
GLOVE BIOGEL M STRL SZ7.5 (GLOVE) ×7 IMPLANT
GOWN STRL REUS W/TWL LRG LVL3 (GOWN DISPOSABLE) ×3 IMPLANT
GOWN STRL REUS W/TWL XL LVL3 (GOWN DISPOSABLE) ×3 IMPLANT
HOLDER FOLEY CATH W/STRAP (MISCELLANEOUS) ×2 IMPLANT
IV NS IRRIG 3000ML ARTHROMATIC (IV SOLUTION) IMPLANT
KIT ASPIRATION TUBING (SET/KITS/TRAYS/PACK) ×3 IMPLANT
MANIFOLD NEPTUNE II (INSTRUMENTS) ×3 IMPLANT
NS IRRIG 1000ML POUR BTL (IV SOLUTION) ×3 IMPLANT
PACK CYSTO (CUSTOM PROCEDURE TRAY) ×3 IMPLANT
SCRUB PCMX 4 OZ (MISCELLANEOUS) ×1 IMPLANT
SYRINGE IRR TOOMEY STRL 70CC (SYRINGE) ×2 IMPLANT
TUBING CONNECTING 10 (TUBING) ×2 IMPLANT
TUBING CONNECTING 10' (TUBING) ×1

## 2014-04-08 NOTE — Anesthesia Preprocedure Evaluation (Signed)
Anesthesia Evaluation  Patient identified by MRN, date of birth, ID band Patient awake    Reviewed: Allergy & Precautions, H&P , NPO status , Patient's Chart, lab work & pertinent test results  Airway Mallampati: II TM Distance: >3 FB Neck ROM: full    Dental no notable dental hx. (+) Teeth Intact, Dental Advisory Given   Pulmonary neg pulmonary ROS, former smoker,  breath sounds clear to auscultation  Pulmonary exam normal       Cardiovascular Exercise Tolerance: Good hypertension, Pt. on medications + CAD + dysrhythmias Atrial Fibrillation Rhythm:regular Rate:Normal     Neuro/Psych negative neurological ROS  negative psych ROS   GI/Hepatic negative GI ROS, Neg liver ROS,   Endo/Other  negative endocrine ROS  Renal/GU CRFRenal disease  negative genitourinary   Musculoskeletal   Abdominal   Peds  Hematology negative hematology ROS (+)   Anesthesia Other Findings   Reproductive/Obstetrics negative OB ROS                           Anesthesia Physical Anesthesia Plan  ASA: III  Anesthesia Plan: General   Post-op Pain Management:    Induction: Intravenous  Airway Management Planned: LMA  Additional Equipment:   Intra-op Plan:   Post-operative Plan:   Informed Consent: I have reviewed the patients History and Physical, chart, labs and discussed the procedure including the risks, benefits and alternatives for the proposed anesthesia with the patient or authorized representative who has indicated his/her understanding and acceptance.   Dental Advisory Given  Plan Discussed with: CRNA and Surgeon  Anesthesia Plan Comments:         Anesthesia Quick Evaluation

## 2014-04-08 NOTE — Care Management Note (Signed)
Ur complete.  

## 2014-04-08 NOTE — H&P (Signed)
Reason For Visit Cystoscopy   Active Problems Problems  1. Bladder cancer (188.9)   Assessed By: Carolan Clines (Urology); Last Assessed: 04 Feb 2014 2. Gross hematuria (599.71)   Assessed By: Carolan Clines (Urology); Last Assessed: 04 Feb 2014  History of Present Illness     80 yo male returns today for a cystoscopy for recent hx of gross hematuria. He states that he stopped his Warfarin & gross hematuria resolved.  He saw Steele Berg, NP on 01/17/14, w/ c/o 5-day hx of painless gross hematuria w/ occasional pieces of clots associated w/ urge incontinence. Blood was noted at the beginning of stream and clears up at the end of the stream. He is on 81mg  ASA and chronic warfarin for hx of afib. Last INR check was "4.3" eight days ago and had to have the warfarin dose reduced but has not had a recheck of INR. He had one episode of gross hematuria 2 months ago during his every 2-3 month follow w/ Dr. Alen Blew, his oncologist, but no intervention needed since hematuria cleared up spontaneously. Denies any other associate signs/ symptoms. Denies loss of appetite or unintentional weight loss. Denies dizziness. He feels occasional incomplete bladder emptying.     He was seeing by a urologist at [facility] 8 months ago for 3rd opinion on cystectomy vs palliative treatment and was told that there are noting they could do for him and was predicted that he has 3-6 months to live.  He sees his oncologist Dr. Alen Blew every 2-3 months for f/u but not on chemo due to lack of its effectiveness to this particular type of bladder cancer.  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 Gaynelle Arabian on workup for gross hematuria. Lesion at trigone, NOT bladder dome. Bone Scan 2/21014 normal. CT 07/2012 without  distant disease. No h/o pelvic radiation or chronic catheter. Due to the aggressive nature of this cancer, he was seen by Dr. Tresa Moore for 2nd opinion on cystectomy vs palliative treatment. He was also referred to [facility] for 3rd opinion.    2 ) Prostate Cancer - s/p primary cryotherapy 01/2012 for Gleason 6 adenocarcinoma by Dr. Gaynelle Arabian. Pre-op PSA 1.8. Post op PSA 0.03. Therapy given as part of plan to help with LUTS which actually has improved.   Past Medical History Problems  1. History of Acute Myocardial Infarction (V12.59) 2. History of Asthma (493.90) 3. History of Atrial fibrillation (427.31) 4. History of Gout (274.9) 5. History of esophageal reflux (V12.79) 6. History of hepatitis (V12.09) 7. History of hypertension (V12.59) 8. History of Pancreatitis (577.9)  Surgical History Problems  1. History of Cystoscopy With Fulguration Medium Lesion (2-5cm) 2. History of No Surgical Problems 3. History of Surgery Prostate Cryosurgical Ablation  Current Meds 1. Allopurinol 300 MG Oral Tablet;  Therapy: (Recorded:16Sep2013) to Recorded 2. Aspirin 81 MG Oral Tablet;  Therapy: (Recorded:18Jul2012) to Recorded 3. Azelastine HCl SOLN;  Therapy: (Recorded:16Sep2013) to Recorded 4. CloNIDine HCl - 0.1 MG Oral Tablet;  Therapy: (Recorded:05Dec2012) to Recorded 5. Hydrochlorothiazide 25 MG Oral Tablet;  Therapy: (Recorded:16Sep2013) to Recorded 6. ICaps CAPS;  Therapy: (Recorded:13Aug2012) to Recorded 7. Lisinopril 40 MG Oral Tablet;  Therapy: (Recorded:18Jul2012) to Recorded 8. Losartan Potassium 50 MG Oral Tablet;  Therapy: (Recorded:16Sep2013) to Recorded 9.  Myrbetriq 50 MG Oral Tablet Extended Release 24 Hour; TAKE 1 TABLET ONCE DAILY;  Therapy: [id number] to (Last Rx:07Jan2015)  Requested for: [id number] Ordered 10. Oxybutynin Chloride 5 MG Oral Tablet; TAKE 1 TABLET Every 8 hours PRN;   Therapy: (Recorded:10Mar2014) to Recorded 11. Warfarin Sodium 2 MG Oral Tablet;    Therapy: (Recorded:18Jul2012) to Recorded  Allergies Medication  1. No Known Drug Allergies  Family History Problems  1. Family history of Family Health Status Number Of Children   2 sons 2. Family history of Father Deceased At Age 19   stroke 3. Family history of Mother Deceased At Age 77   natural causes 4. Family history of Transient Ischemic Attack : Mother 5. Family history of Transient Ischemic Attack : Father  Social History Problems  1. Alcohol Use   4 per day 2. Caffeine Use   1 per day 3. Former smoker (V15.82)   quit 80yrs ago 4. Marital History - Currently Married 5. Marital History - Widowed 6. Occupation: Retired  Review of Systems  Genitourinary: feelings of urinary urgency, incomplete emptying of bladder and hematuria, but no urinary frequency, no dysuria, no difficulty starting the urinary stream, no post-void dribbling and initiating urination does not require straining.  Gastrointestinal: no nausea, no vomiting, no flank pain and no abdominal pain.    Vitals Vital Signs [Data Includes: Last 1 Day]  Recorded: 20Mar2015 02:47PM  Blood Pressure: 166 / 58 Temperature: 97.7 F Heart Rate: 54  Procedure  Procedure: Cystoscopy  Chaperone Present: kim Bobby Rumpf.  Indication: Hematuria. History of Urothelial Carcinoma.  Informed Consent: Risks, benefits, and potential adverse events were discussed and informed consent was obtained from the patient.  Prep: The patient was prepped with betadine.  Anesthesia:. Local anesthesia was administered intraurethrally with 2% lidocaine jelly.  Antibiotic prophylaxis: Ciprofloxacin.  Procedure Note:  Urethral meatus:. No abnormalities.  Anterior urethra: No abnormalities.  Prostatic urethra:. An enlarged intravesical median lobe was visualized.  Bladder: Visulization was obscured due to blood. The ureteral orifices were in the normal anatomic position bilaterally. Examination of the bladder demonstrated clot within  the bladder and trabeculation erythematous mucosa. Multiple tumors were identified in the bladder. A papillary tumor was seen in the bladder measuring approximately 4 cm in size. This tumor was located toward the midline, at the neck of the bladder. Another papillary tumor was seen in the bladder measuring approximately 2 cm in size. This tumor was located on the left side, on the anterior aspect, at the base of the bladder.    Assessment Assessed  1. Bladder cancer (188.9) 2. Gross hematuria (599.71)  Known TCC bladder and A Fib of coumadin, now bleeding again. Pt was seen as 3rd opinion by [facility], who simply told pt to "go home", that he has "3 months to live". He has now lived almost 1 yr since. He called Uro-oncology at [place] and re-current bleeding, and was told to go see his " Tax adviser".   Cystoscopy shows recurrence of tumor at 12: o'clock behind the bladder neck. This will be difficult to resect. I have shown the tumor to his sons, and they understand that he will need cystoscopy and repeat resection.    Very disappointed in [facility]   Plan Urinary urgency  1. PVR U/S; Status:Hold For - Date of Service; Requested for:20Mar2015;   Cysto and   TUR-BT.   Discussion/Summary cc: Dr. Alen Blew     Signatures Electronically signed by : Carolan Clines, M.D.; Feb 04 2014  3:53PM EST

## 2014-04-08 NOTE — Transfer of Care (Signed)
Immediate Anesthesia Transfer of Care Note  Patient: Terry [name]  Procedure(s) Performed: Procedure(s): TRANSURETHRAL RESECTION OF BLADDER TUMOR (TURBT) (N/A)  Patient Location: PACU  Anesthesia Type:General  Level of Consciousness: sedated  Airway & Oxygen Therapy: Patient Spontanous Breathing and Patient connected to face mask oxygen  Post-op Assessment: Report given to PACU RN and Post -op Vital signs reviewed and stable  Post vital signs: Reviewed and stable  Complications: No apparent anesthesia complications

## 2014-04-08 NOTE — Op Note (Signed)
Pre-operative diagnosis :    Recurrent TCC, right hemitrigone, extending to the right bladder neck, 7 cm, with tissue necrosis  Postoperative diagnosis:  Same  Operation:  Cystourethroscopy, transurethral resection of recurrent bladder cancer  Surgeon:  S. Gaynelle Arabian, MD  First assistant:  None  Anesthesia:  General LMA  Preparation:  After appropriate preanesthesia, the patient was brought to the operative room, placed on the operating table in the dorsal supine position where general LMA anesthesia was introduced. He was then replaced in the dorsal lithotomy position with pubis was prepped with Betadine solution and draped in usual fashion.  Review history:  80 yo male returns today for a cystoscopy for recent hx of gross hematuria. He states that he stopped his Warfarin & gross hematuria resolved. He saw Steele Berg, NP on 01/17/14, w/ c/o 5-day hx of painless gross hematuria w/ occasional pieces of clots associated w/ urge incontinence. Blood was noted at the beginning of stream and clears up at the end of the stream. He is on 81mg  ASA and chronic warfarin for hx of afib. Last INR check was "4.3" eight days ago and had to have the warfarin dose reduced but has not had a recheck of INR. He had one episode of gross hematuria 2 months ago during his every 2-3 month follow w/ Dr. Alen Blew, his oncologist, but no intervention needed since hematuria cleared up spontaneously. Denies any other associate signs/ symptoms. Denies loss of appetite or unintentional weight loss. Denies dizziness. He feels occasional incomplete bladder emptying.  He was seeing by a urologist at [facility] 8 months ago for 3rd opinion on cystectomy vs palliative treatment and was told that there are noting they could do for him and was predicted that he has 3-6 months to live. He sees his oncologist Dr. Alen Blew every 2-3 months for f/u but not on chemo due to lack of its effectiveness to this particular type of bladder cancer.  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 Gaynelle Arabian on workup for gross hematuria. Lesion at trigone, NOT bladder dome. Bone Scan 2/21014 normal. CT 07/2012 without distant disease. No h/o pelvic radiation or chronic catheter. Due to the aggressive nature of this cancer, he was seen by Dr. Tresa Moore for 2nd opinion on cystectomy vs palliative treatment. He was also referred to [facility] for 3rd opinion.  2 ) Prostate Cancer - s/p primary cryotherapy 01/2012 for Gleason 6 adenocarcinoma by Dr. Gaynelle Arabian. Pre-op PSA 1.8. Post op PSA 0.03. Therapy given as part of plan to help with LUTS which actually has improved.      Statement of  Likelihood of Success: Excellent. TIME-OUT observed.:  Procedure:  Cystourethroscopy was accomplished, and photodocumentation was accomplished of the large recurrent right hemitrigone bladder tumor, with obvious necrosis. The tumor was 7 cm, obliterating the right hemitrigone, extending to the right bladder neck. It is noted the patient is status post cryotherapy for prostate cancer as well.  Resection was accomplished of the right hemitrigone, resecting bleeding and necrotic bladder cancer. Extensive cauterization was accomplished. Tissue was sent to laboratory for examination after tissue timeout and tissue chips given to the surgical tech per protocol.  Irrigation was clear, and a size 22 [place] catheter, two-way, was inserted in the bladder, and left to straight drainage.  The patient was awakened, and taken to recovery room in good condition.

## 2014-04-08 NOTE — Interval H&P Note (Signed)
History and Physical Interval Note:  04/08/2014 8:39 AM  Terry [name]  has presented today for surgery, with the diagnosis of BLADDER TUMOR  The various methods of treatment have been discussed with the patient and family. After consideration of risks, benefits and other options for treatment, the patient has consented to  Procedure(s): TRANSURETHRAL RESECTION OF BLADDER TUMOR (TURBT) (N/A) as a surgical intervention .  The patient's history has been reviewed, patient examined, no change in status, stable for surgery.  I have reviewed the patient's chart and labs.  Questions were answered to the patient's satisfaction.     Ailene Rud

## 2014-04-08 NOTE — Progress Notes (Signed)
Urology Progress Note  Day of Surgery  AF VSS. Pt has clear urine.  + spasms, and leakage of urine around foley catheter.  Subjective:     No acute urologic events overnight. Ambulation:   negative Flatus:    negative Bowel movement  negative  Pain: complete resolution  Objective:  Blood pressure 150/55, pulse 50, temperature 97.8 F (36.6 C), temperature source Oral, resp. rate 15, SpO2 99.00%.  Physical Exam:  General:  No acute distress, awake Extremities: extremities normal, atraumatic, no cyanosis or edema Genitourinary:  Foley in good position. Draining well.  Foley:  As above.        No results found for this basename: HGB, WBC, PLT,  in the last 72 hours  Recent Labs     04/08/14  0710  CREATININE  2.22*  GFRNONAA  24*  GFRAA  28*     Recent Labs     04/08/14  0710  INR  1.12  APTT  33     No components found with this basename: ABG,   Assessment/Plan:  Catheter not removed.  Ambulate tiday. Plan for d/c Saturday if urine remains clear.

## 2014-04-08 NOTE — Discharge Instructions (Signed)
Bladder Cancer Bladder cancer is an abnormal growth of tissue in your bladder. Your bladder is the balloon-like sac in your pelvis. It collects and stores urine that comes from the kidneys through the ureters. The bladder wall is made of layers. If cancer spreads into these layers and through the wall of the bladder, it becomes more difficult to treat.  There are four stages of bladder cancer:  Stage I. Cancer at this stage occurs in the bladder's inner lining but has not invaded the muscular bladder wall.  Stage II. At this stage, cancer has invaded the bladder wall but is still confined to the bladder.  Stage III. By this stage, the cancer cells have spread through the bladder wall to surrounding tissue. They may also have spread to the prostate in men or the uterus or vagina in women.  Stage IV. By this stage, cancer cells may have spread to the lymph nodes and other organs, such as your lungs, bones, or liver. RISK FACTORS Although the cause of bladder cancer is not known, the following risk factors can increase your chances of getting bladder cancer:   Smoking.   Occupational exposures, such as rubber, leather, textile, dyes, chemicals, and paint.   Being white.   Age.   Being male.   Having chronic bladder inflammation.   Having a bladder cancer history.   Having a family history of bladder cancer (heredity).   Having had chemotherapy or radiation therapy to the pelvis.   Being exposed to arsenic.  SYMPTOMS   Blood in the urine.   Pain with urination.   Frequent bladder or urine infections.  Increase in urgency and frequency of urination. DIAGNOSIS  Your health care provider may suspect bladder cancer based on your description of urinary symptoms or based on the finding of blood or infection in the urine (especially if this has recurred several times). Other tests or procedures that may be performed include:   A narrow tube being inserted into your  bladder through your urethra (cystoscopy) in order to view the lining of your bladder for tumors.   A biopsy to sample the tumor to see if cancer is present.  If cancer is present, it will then be staged to determine its severity and extent. It is important to know how deeply into the bladder wall the cancer has grown and whether the cancer has spread to any other parts of your body. Staging may require blood tests or special scans such as a CT scan, MRI, bone scan, or chest X-ray.  TREATMENT  Once your cancer has been diagnosed and staged, you should discuss a treatment plan with your health care provider. Based on the stage of the cancer, one treatment or a combination of treatments may be recommended. The most common forms of treatment are:   Surgery. Procedures that may be done include transurethral resection and cystectomy.  Radiation therapy. This is infrequently used to treat bladder cancer.   Chemotherapy. During this treatment, drugs are used to kill cancer cells.  Immunotherapy. This is usually administered directly into the bladder. HOME CARE INSTRUCTIONS  Only take over-the-counter or prescription medicines for pain, discomfort, or fever as directed by your health care provider.   Maintain a healthy diet.   Consider joining a support group. This may help you learn to cope with the stress of having bladder cancer.   Seek advice to help you manage treatment side effects.   Keep all follow-up appointments as directed by your health care  provider.   Inform your cancer specialist if you are admitted to the hospital.  [place] IF:  There is blood in your urine.  You have symptoms of a urinary tract infection. These include:  Tiredness.  Shakiness.  Weakness.  Muscle aches.  Abdominal pain.  Frequent and intense urge to urinate (in young women).  Burning feeling in the bladder or urethra during urination (in young women). SEEK IMMEDIATE MEDICAL  CARE IF:  You are unable to urinate. Document Released: 11/07/2003 Document Revised: 07/07/2013 Document Reviewed: 04/27/2013 [facility] Patient Information 2014 [place].

## 2014-04-08 NOTE — Anesthesia Postprocedure Evaluation (Signed)
  Anesthesia Post-op Note  Patient: Terry [name]  Procedure(s) Performed: Procedure(s) (LRB): TRANSURETHRAL RESECTION OF BLADDER TUMOR (TURBT) (N/A)  Patient Location: PACU  Anesthesia Type: General  Level of Consciousness: awake and alert   Airway and Oxygen Therapy: Patient Spontanous Breathing  Post-op Pain: mild  Post-op Assessment: Post-op Vital signs reviewed, Patient's Cardiovascular Status Stable, Respiratory Function Stable, Patent Airway and No signs of Nausea or vomiting  Last Vitals:  Filed Vitals:   04/08/14 1123  BP: 150/55  Pulse: 50  Temp: 36.6 C  Resp: 15    Post-op Vital Signs: stable   Complications: No apparent anesthesia complications

## 2014-04-09 NOTE — Progress Notes (Signed)
Pt left at this time with his son at his side. Pt alert, oriented, and without c/o. Pt voided X 3 this morning with slight blood-tinged urine, and no c/o pain. Discharge instructions/prescriptions given/explained with pt and son verbalizing understanding.  Followup appointments noted.

## 2014-04-09 NOTE — Progress Notes (Signed)
Vitals normal Trial of voiding Labs normal Send home today

## 2014-04-10 NOTE — Discharge Summary (Signed)
Physician Discharge Summary  Patient ID: Terry [name] MRN: [id number] DOB/AGE: [birth year] 80 y.o.  Admit date: 04/08/2014 Discharge date: 04/10/2014  Admission Diagnoses: BLADDER TUMOR  Discharge Diagnoses:  Active Problems:   Bladder cancer   Discharged Condition: stable  Hospital Course:   TUR-BT ( 7cm, Rigit trigone to bladder neck)  Consults: none  Significant Diagnostic Studies: No results found.  Treatments: IV hydration  Discharge Exam: Blood pressure 123/48, pulse 90, temperature 97.5 F (36.4 C), temperature source Axillary, resp. rate 17, height 5\' 10"  (1.778 m), weight 69.4 kg (153 lb), SpO2 100.00%. General appearance: alert and cooperative Urine clear. Foley out.  Disposition: 01-Home or Self Care  Discharge Instructions   Discharge patient    Complete by:  As directed   In AM per urology on call MD     Discontinue IV    Complete by:  As directed   In AM            Medication List         allopurinol 300 MG tablet  Commonly known as:  ZYLOPRIM  Take 300 mg by mouth daily. For gout     aspirin 81 MG chewable tablet  Chew 81 mg by mouth every morning. Has stopped     cloNIDine 0.1 MG tablet  Commonly known as:  CATAPRES  Take 0.1-0.2 mg by mouth 2 (two) times daily. 2 in the morning and 1 at night     hydrochlorothiazide 25 MG tablet  Commonly known as:  HYDRODIURIL  Take 25 mg by mouth daily before breakfast.     HYDROcodone-acetaminophen 10-325 MG per tablet  Commonly known as:  NORCO  Take 1 tablet by mouth every 6 (six) hours as needed for moderate pain.     ICAPS PO  Take 1 capsule by mouth 2 (two) times daily.     lisinopril 40 MG tablet  Commonly known as:  PRINIVIL,ZESTRIL  Take 20 mg by mouth 2 (two) times daily. Takes 1/2     losartan 50 MG tablet  Commonly known as:  COZAAR  Take 50 mg by mouth 2 (two) times daily.     MYRBETRIQ 50 MG Tb24 tablet  Generic drug:  mirabegron ER  Take 50 mg by mouth daily before  breakfast.     oxybutynin 5 MG tablet  Commonly known as:  DITROPAN  Take 5 mg by mouth every 8 (eight) hours as needed for bladder spasms.     phenazopyridine 200 MG tablet  Commonly known as:  PYRIDIUM  - Take 1 tablet (200 mg total) by mouth 3 (three) times daily as needed for pain. For urinary burning  - Note: will stain clothing     REFRESH OP  Apply 1 drop to eye 3 (three) times daily as needed (dry eyes).     traMADol-acetaminophen 37.5-325 MG per tablet  Commonly known as:  ULTRACET  Take 1 tablet by mouth every 6 (six) hours as needed.     trimethoprim 100 MG tablet  Commonly known as:  TRIMPEX  Take 1 tablet (100 mg total) by mouth 1 day or 1 dose.     warfarin 2 MG tablet  Commonly known as:  COUMADIN  Take 2-3.5 mg by mouth daily. 1.5 tablets on Monday Wednesday and Fridays. Takes 1 tablet on all other days           Follow-up Information   Follow up with Carolan Clines I, MD.   Specialty:  Urology  Contact information:   [place] [address] [phone]       Signed: Ailene Rud 04/10/2014, 9:54 AM

## 2014-04-12 ENCOUNTER — Encounter (HOSPITAL_COMMUNITY): Payer: Self-pay | Admitting: Urology

## 2014-04-26 ENCOUNTER — Telehealth: Payer: Self-pay | Admitting: *Deleted

## 2014-04-26 NOTE — Telephone Encounter (Signed)
Spoke with pt who states he has order to start levofloxacin  500mg  daily times 5 days for UTI  and states has not started this medication because he is concerned that this medication will increase INR.Pt has not been seen since March  6th 2015 and he states that has had surgery and has held coumadin at times between being seen so appt made for him to be seen in clinic on Friday  June 12th and pt instructed to start Levofloxacin and to see [place] in clinic on Friday and he states understanding.

## 2014-04-29 ENCOUNTER — Ambulatory Visit (INDEPENDENT_AMBULATORY_CARE_PROVIDER_SITE_OTHER): Payer: Medicare Other

## 2014-04-29 DIAGNOSIS — I4891 Unspecified atrial fibrillation: Secondary | ICD-10-CM

## 2014-04-29 LAB — POCT INR: INR: 5.8

## 2014-05-03 ENCOUNTER — Ambulatory Visit (INDEPENDENT_AMBULATORY_CARE_PROVIDER_SITE_OTHER): Payer: Medicare Other | Admitting: Pharmacist

## 2014-05-03 DIAGNOSIS — I4891 Unspecified atrial fibrillation: Secondary | ICD-10-CM

## 2014-05-03 LAB — POCT INR: INR: 2.2

## 2014-05-10 ENCOUNTER — Ambulatory Visit (INDEPENDENT_AMBULATORY_CARE_PROVIDER_SITE_OTHER): Payer: Medicare Other | Admitting: Pharmacist Clinician (PhC)/ Clinical Pharmacy Specialist

## 2014-05-10 DIAGNOSIS — I4891 Unspecified atrial fibrillation: Secondary | ICD-10-CM

## 2014-05-10 LAB — POCT INR: INR: 1.4

## 2014-05-19 ENCOUNTER — Ambulatory Visit (INDEPENDENT_AMBULATORY_CARE_PROVIDER_SITE_OTHER): Payer: Medicare Other | Admitting: *Deleted

## 2014-05-19 DIAGNOSIS — I4891 Unspecified atrial fibrillation: Secondary | ICD-10-CM

## 2014-05-19 LAB — POCT INR: INR: 2.3

## 2014-05-25 ENCOUNTER — Other Ambulatory Visit (HOSPITAL_BASED_OUTPATIENT_CLINIC_OR_DEPARTMENT_OTHER): Payer: Medicare Other

## 2014-05-25 ENCOUNTER — Encounter: Payer: Self-pay | Admitting: Oncology

## 2014-05-25 ENCOUNTER — Telehealth: Payer: Self-pay | Admitting: Oncology

## 2014-05-25 ENCOUNTER — Ambulatory Visit (HOSPITAL_BASED_OUTPATIENT_CLINIC_OR_DEPARTMENT_OTHER): Payer: Medicare Other | Admitting: Oncology

## 2014-05-25 VITALS — BP 117/46 | HR 50 | Temp 97.2°F | Resp 16 | Ht 70.0 in | Wt 146.0 lb

## 2014-05-25 DIAGNOSIS — C7951 Secondary malignant neoplasm of bone: Secondary | ICD-10-CM

## 2014-05-25 DIAGNOSIS — C679 Malignant neoplasm of bladder, unspecified: Secondary | ICD-10-CM

## 2014-05-25 DIAGNOSIS — C7952 Secondary malignant neoplasm of bone marrow: Secondary | ICD-10-CM

## 2014-05-25 DIAGNOSIS — R319 Hematuria, unspecified: Secondary | ICD-10-CM

## 2014-05-25 LAB — COMPREHENSIVE METABOLIC PANEL (CC13)
ALK PHOS: 107 U/L (ref 40–150)
ALT: 8 U/L (ref 0–55)
AST: 14 U/L (ref 5–34)
Albumin: 3.6 g/dL (ref 3.5–5.0)
Anion Gap: 6 mEq/L (ref 3–11)
BUN: 46.9 mg/dL — ABNORMAL HIGH (ref 7.0–26.0)
CALCIUM: 9.2 mg/dL (ref 8.4–10.4)
CO2: 15 mEq/L — ABNORMAL LOW (ref 22–29)
CREATININE: 2.6 mg/dL — AB (ref 0.7–1.3)
Chloride: 117 mEq/L — ABNORMAL HIGH (ref 98–109)
Glucose: 88 mg/dl (ref 70–140)
Potassium: 5.8 mEq/L — ABNORMAL HIGH (ref 3.5–5.1)
Sodium: 138 mEq/L (ref 136–145)
Total Bilirubin: 0.36 mg/dL (ref 0.20–1.20)
Total Protein: 5.7 g/dL — ABNORMAL LOW (ref 6.4–8.3)

## 2014-05-25 LAB — CBC WITH DIFFERENTIAL/PLATELET
BASO%: 0.5 % (ref 0.0–2.0)
Basophils Absolute: 0 10*3/uL (ref 0.0–0.1)
EOS%: 0.9 % (ref 0.0–7.0)
Eosinophils Absolute: 0 10*3/uL (ref 0.0–0.5)
HCT: 31.1 % — ABNORMAL LOW (ref 38.4–49.9)
HGB: 9.8 g/dL — ABNORMAL LOW (ref 13.0–17.1)
LYMPH%: 26 % (ref 14.0–49.0)
MCH: 29.6 pg (ref 27.2–33.4)
MCHC: 31.3 g/dL — AB (ref 32.0–36.0)
MCV: 94.5 fL (ref 79.3–98.0)
MONO#: 0.4 10*3/uL (ref 0.1–0.9)
MONO%: 8.6 % (ref 0.0–14.0)
NEUT#: 3.3 10*3/uL (ref 1.5–6.5)
NEUT%: 64 % (ref 39.0–75.0)
Platelets: 184 10*3/uL (ref 140–400)
RBC: 3.29 10*6/uL — ABNORMAL LOW (ref 4.20–5.82)
RDW: 15.9 % — ABNORMAL HIGH (ref 11.0–14.6)
WBC: 5.1 10*3/uL (ref 4.0–10.3)
lymph#: 1.3 10*3/uL (ref 0.9–3.3)

## 2014-05-25 NOTE — Progress Notes (Signed)
Hematology and Oncology Follow Up Visit  HASSEN BRUUN [id number] 03-17-1921 80 y.o. 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 A PET CT scan showed retroperitoneal, pelvic adenopathy and possible L4 bone lesion.   Prior Therapy: He is status post TURBT on February of 2014 with the pathology showing well-differentiated adenocarcinoma tumor involving the muscularis propria.  Current therapy: Supportive care only he declined chemotherapy for the time being.  Interim History:  Mr. Barfield presents today for a followup visit. Since his last visit, he is being stable. He is reporting intermittent hematuria at this time and it's not persistent. It is reporting incontinence and have a condom catheter in place. He is not reporting any pelvic pain or difficulty urination. He is not reporting any flank pain or discomfort. He Is not reporting any abdominal pain. Is not reporting any back pain or deterioration in his performance status or quality of life. Is continued to be active with an excellent appetite and weight continue to be stable. He has not reported any headaches or blurry vision or double vision or syncope. He did report occasional unsteadiness but no falls. Did not report any chest pain or shortness of breath. Does not report any nausea or vomiting. Does not report any musculoskeletal complaints. Rest of his review of systems was unremarkable.  Medications: I have reviewed the patient's current medications.  Current Outpatient Prescriptions  Medication Sig Dispense Refill  . allopurinol (ZYLOPRIM) 300 MG tablet Take 300 mg by mouth daily. For gout      . aspirin 81 MG chewable tablet Chew 81 mg by mouth every morning. Has stopped      . cloNIDine (CATAPRES) 0.1 MG tablet Take 0.1-0.2 mg by mouth 2 (two) times daily. 2 in the morning and 1 at night      . hydrochlorothiazide (HYDRODIURIL) 25 MG  tablet Take 25 mg by mouth daily before breakfast.       . HYDROcodone-acetaminophen (NORCO) 10-325 MG per tablet Take 1 tablet by mouth every 6 (six) hours as needed for moderate pain.       Marland Kitchen lisinopril (PRINIVIL,ZESTRIL) 40 MG tablet Take 20 mg by mouth 2 (two) times daily. Takes 1/2      . losartan (COZAAR) 50 MG tablet Take 50 mg by mouth 2 (two) times daily.      . mirabegron ER (MYRBETRIQ) 50 MG TB24 Take 50 mg by mouth daily before breakfast.      . Multiple Vitamins-Minerals (ICAPS PO) Take 1 capsule by mouth 2 (two) times daily.       Marland Kitchen oxybutynin (DITROPAN) 5 MG tablet Take 5 mg by mouth every 8 (eight) hours as needed for bladder spasms.      . phenazopyridine (PYRIDIUM) 200 MG tablet Take 1 tablet (200 mg total) by mouth 3 (three) times daily as needed for pain. For urinary burning Note: will stain clothing  30 tablet  3  . Polyvinyl Alcohol-Povidone (REFRESH OP) Apply 1 drop to eye 3 (three) times daily as needed (dry eyes).       . traMADol-acetaminophen (ULTRACET) 37.5-325 MG per tablet Take 1 tablet by mouth every 6 (six) hours as needed.  30 tablet  0  . trimethoprim (TRIMPEX) 100 MG tablet Take 1 tablet (100 mg total) by mouth 1 day or 1 dose.  30 tablet  1  . warfarin (COUMADIN) 2 MG tablet Take as directed by anticoagulation clinic  No current facility-administered medications for this visit.     Allergies:  No Known Allergies  Physical Exam: Blood pressure 117/46, pulse 50, temperature 97.2 F (36.2 C), temperature source Oral, resp. rate 16, height 5\' 10"  (1.778 m), weight 146 lb (66.225 kg), SpO2 100.00%. ECOG: 1 General appearance: alert awake not in any distress. Head: Normocephalic, without obvious abnormality, atraumatic Neck: no adenopathy Lymph nodes: Cervical, supraclavicular, and axillary nodes normal. Heart:regular rate and rhythm, S1, S2 normal, no murmur, click, rub or gallop Lung:chest clear, no wheezing, rales, normal symmetric air  entry Abdomin: soft, non-tender, without masses or organomegaly EXT:no erythema, induration, or nodules   Lab Results: Lab Results  Component Value Date   WBC 5.1 05/25/2014   HGB 9.8* 05/25/2014   HCT 31.1* 05/25/2014   MCV 94.5 05/25/2014   PLT 184 05/25/2014     Chemistry      Component Value Date/Time   NA 140 04/01/2014 1130   NA 146* 03/30/2014 0935   K 5.2 04/01/2014 1130   K 5.2* 03/30/2014 0935   CL 109 04/01/2014 1130   CL 114* 05/04/2013 0858   CO2 20 04/01/2014 1130   CO2 19* 03/30/2014 0935   BUN 50* 04/01/2014 1130   BUN 48.8* 03/30/2014 0935   CREATININE 1.95* 04/08/2014 1239   CREATININE 2.2* 03/30/2014 0935      Component Value Date/Time   CALCIUM 9.4 04/01/2014 1130   CALCIUM 9.5 03/30/2014 0935   ALKPHOS 99 03/30/2014 0935   AST 15 03/30/2014 0935   ALT 8 03/30/2014 0935   BILITOT 0.51 03/30/2014 0935       Impression and Plan:  80 year old gentleman with the following issues:  1. Stage IV adenocarcinoma of the bladder:  He is continued to refuse systemic chemotherapy for the time being. We can consider palliative radiation therapy if he develops persistent hematuria that is not affected he to local therapy and surgical intervention.   2. Bony disease: He has very limited disease at L4 and he is asymptomatic from it. He develops back pain will obtain an MRI and address that area.  3. Hematuria: This has subsided. We can consider palliative radiation therapy if needed to.  4. History of prostate cancer: Diagnosed in 2013 Gleason score 3+3 equals 6 he is status post cryoablation his PSA is under control.  [facility], MD 7/8/20154:18 PM

## 2014-05-25 NOTE — Telephone Encounter (Signed)
gv and printed appt sched and avs for pt for OCT. °

## 2014-05-27 ENCOUNTER — Other Ambulatory Visit: Payer: Self-pay | Admitting: Cardiology

## 2014-06-02 ENCOUNTER — Ambulatory Visit (INDEPENDENT_AMBULATORY_CARE_PROVIDER_SITE_OTHER): Payer: Medicare Other | Admitting: *Deleted

## 2014-06-02 DIAGNOSIS — I4891 Unspecified atrial fibrillation: Secondary | ICD-10-CM

## 2014-06-02 LAB — POCT INR: INR: 2.1

## 2014-06-23 ENCOUNTER — Ambulatory Visit (INDEPENDENT_AMBULATORY_CARE_PROVIDER_SITE_OTHER): Payer: Medicare Other | Admitting: *Deleted

## 2014-06-23 DIAGNOSIS — I4891 Unspecified atrial fibrillation: Secondary | ICD-10-CM

## 2014-06-23 LAB — POCT INR: INR: 4.5

## 2014-07-04 ENCOUNTER — Ambulatory Visit (INDEPENDENT_AMBULATORY_CARE_PROVIDER_SITE_OTHER): Payer: Medicare Other

## 2014-07-04 DIAGNOSIS — I4891 Unspecified atrial fibrillation: Secondary | ICD-10-CM

## 2014-07-04 LAB — POCT INR: INR: 1.8

## 2014-07-08 ENCOUNTER — Telehealth: Payer: Self-pay | Admitting: Oncology

## 2014-07-08 NOTE — Telephone Encounter (Signed)
Pt cld to r/s due to conflict in sch...Marland KitchenMarland KitchenKJ

## 2014-07-18 ENCOUNTER — Ambulatory Visit (INDEPENDENT_AMBULATORY_CARE_PROVIDER_SITE_OTHER): Payer: Medicare Other

## 2014-07-18 DIAGNOSIS — I4891 Unspecified atrial fibrillation: Secondary | ICD-10-CM

## 2014-07-18 LAB — POCT INR: INR: 2.7

## 2014-08-08 ENCOUNTER — Ambulatory Visit (INDEPENDENT_AMBULATORY_CARE_PROVIDER_SITE_OTHER): Payer: Medicare Other | Admitting: Pharmacist

## 2014-08-08 DIAGNOSIS — I4891 Unspecified atrial fibrillation: Secondary | ICD-10-CM

## 2014-08-08 LAB — POCT INR: INR: 2.7

## 2014-08-16 ENCOUNTER — Other Ambulatory Visit: Payer: Self-pay | Admitting: Urology

## 2014-08-18 ENCOUNTER — Other Ambulatory Visit: Payer: Medicare Other

## 2014-08-18 ENCOUNTER — Ambulatory Visit: Payer: Medicare Other | Admitting: Oncology

## 2014-08-24 ENCOUNTER — Telehealth: Payer: Self-pay | Admitting: Oncology

## 2014-08-24 ENCOUNTER — Other Ambulatory Visit (HOSPITAL_BASED_OUTPATIENT_CLINIC_OR_DEPARTMENT_OTHER): Payer: Medicare Other

## 2014-08-24 ENCOUNTER — Ambulatory Visit (HOSPITAL_BASED_OUTPATIENT_CLINIC_OR_DEPARTMENT_OTHER): Payer: Medicare Other | Admitting: Oncology

## 2014-08-24 ENCOUNTER — Encounter: Payer: Self-pay | Admitting: Oncology

## 2014-08-24 VITALS — BP 149/49 | HR 55 | Temp 97.3°F | Resp 18 | Ht 70.0 in | Wt 147.9 lb

## 2014-08-24 DIAGNOSIS — Z8546 Personal history of malignant neoplasm of prostate: Secondary | ICD-10-CM

## 2014-08-24 DIAGNOSIS — C679 Malignant neoplasm of bladder, unspecified: Secondary | ICD-10-CM

## 2014-08-24 LAB — CBC WITH DIFFERENTIAL/PLATELET
BASO%: 0.2 % (ref 0.0–2.0)
Basophils Absolute: 0 10*3/uL (ref 0.0–0.1)
EOS%: 2.3 % (ref 0.0–7.0)
Eosinophils Absolute: 0.1 10*3/uL (ref 0.0–0.5)
HCT: 30.9 % — ABNORMAL LOW (ref 38.4–49.9)
HGB: 10.1 g/dL — ABNORMAL LOW (ref 13.0–17.1)
LYMPH%: 31.7 % (ref 14.0–49.0)
MCH: 30.2 pg (ref 27.2–33.4)
MCHC: 32.7 g/dL (ref 32.0–36.0)
MCV: 92.5 fL (ref 79.3–98.0)
MONO#: 0.6 10*3/uL (ref 0.1–0.9)
MONO%: 9.1 % (ref 0.0–14.0)
NEUT%: 56.7 % (ref 39.0–75.0)
NEUTROS ABS: 3.4 10*3/uL (ref 1.5–6.5)
PLATELETS: 187 10*3/uL (ref 140–400)
RBC: 3.34 10*6/uL — AB (ref 4.20–5.82)
RDW: 14.3 % (ref 11.0–14.6)
WBC: 6 10*3/uL (ref 4.0–10.3)
lymph#: 1.9 10*3/uL (ref 0.9–3.3)

## 2014-08-24 LAB — COMPREHENSIVE METABOLIC PANEL (CC13)
ALK PHOS: 115 U/L (ref 40–150)
ALT: 10 U/L (ref 0–55)
AST: 17 U/L (ref 5–34)
Albumin: 3.6 g/dL (ref 3.5–5.0)
Anion Gap: 7 mEq/L (ref 3–11)
BUN: 44.8 mg/dL — AB (ref 7.0–26.0)
CO2: 19 mEq/L — ABNORMAL LOW (ref 22–29)
Calcium: 9.5 mg/dL (ref 8.4–10.4)
Chloride: 117 mEq/L — ABNORMAL HIGH (ref 98–109)
Creatinine: 2.2 mg/dL — ABNORMAL HIGH (ref 0.7–1.3)
Glucose: 86 mg/dl (ref 70–140)
Potassium: 5.8 mEq/L — ABNORMAL HIGH (ref 3.5–5.1)
SODIUM: 142 meq/L (ref 136–145)
TOTAL PROTEIN: 5.8 g/dL — AB (ref 6.4–8.3)
Total Bilirubin: 0.51 mg/dL (ref 0.20–1.20)

## 2014-08-24 NOTE — Progress Notes (Signed)
Hematology and Oncology Follow Up Visit  Terry [name] [id number] 11-Oct-1921 80 y.o. 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 A PET CT scan showed retroperitoneal, pelvic adenopathy and possible L4 bone lesion.   Prior Therapy: He is status post TURBT on February of 2014 with the pathology showing well-differentiated adenocarcinoma tumor involving the muscularis propria.  Current therapy: Supportive care only he declined chemotherapy in the past.   Interim History:  Terry [name] presents today for a followup visit with his sons. Since his last visit, he is has been doing relatively fair without any new complications. He is reporting intermittent hematuria at this time and it's not persistent. It is reporting incontinence and have a condom catheter in place. She is scheduled for another cystoscopy and probably  fulguration of the tumor if needed to. He is not reporting any pelvic pain or difficulty urination. He is not reporting any flank pain or discomfort. He Is not reporting any abdominal pain. Is not reporting any back pain or deterioration in his performance status or quality of life. Is continued to be active with an excellent appetite and weight continue to be stable. He has not reported any headaches or blurry vision or double vision or syncope. He did report occasional unsteadiness but no falls. Did not report any chest pain or shortness of breath. Does not report any nausea or vomiting. Does not report any musculoskeletal complaints. Rest of his review of systems was unremarkable.  Medications: I have reviewed the patient's current medications.  Current Outpatient Prescriptions  Medication Sig Dispense Refill  . allopurinol (ZYLOPRIM) 300 MG tablet Take 300 mg by mouth daily. For gout      . aspirin 81 MG chewable tablet Chew 81 mg by mouth every morning. Has stopped      . cloNIDine  (CATAPRES) 0.1 MG tablet Take 0.1-0.2 mg by mouth 2 (two) times daily. 2 in the morning and 1 at night      . hydrochlorothiazide (HYDRODIURIL) 25 MG tablet Take 25 mg by mouth daily before breakfast.       . HYDROcodone-acetaminophen (NORCO) 10-325 MG per tablet Take 1 tablet by mouth every 6 (six) hours as needed for moderate pain.       Marland Kitchen lisinopril (PRINIVIL,ZESTRIL) 40 MG tablet Take 20 mg by mouth 2 (two) times daily. Takes 1/2      . losartan (COZAAR) 50 MG tablet Take 50 mg by mouth 2 (two) times daily.      . mirabegron ER (MYRBETRIQ) 50 MG TB24 Take 50 mg by mouth daily before breakfast.      . Multiple Vitamins-Minerals (ICAPS PO) Take 1 capsule by mouth 2 (two) times daily.       Marland Kitchen oxybutynin (DITROPAN) 5 MG tablet Take 5 mg by mouth every 8 (eight) hours as needed for bladder spasms.      . phenazopyridine (PYRIDIUM) 200 MG tablet Take 1 tablet (200 mg total) by mouth 3 (three) times daily as needed for pain. For urinary burning Note: will stain clothing  30 tablet  3  . Polyvinyl Alcohol-Povidone (REFRESH OP) Apply 1 drop to eye 3 (three) times daily as needed (dry eyes).       . traMADol-acetaminophen (ULTRACET) 37.5-325 MG per tablet Take 1 tablet by mouth every 6 (six) hours as needed.  30 tablet  0  . trimethoprim (TRIMPEX) 100 MG tablet Take 1 tablet (100 mg total)  by mouth 1 day or 1 dose.  30 tablet  1  . warfarin (COUMADIN) 2 MG tablet TAKE 1&1/2 TABLETS DAILY EXCEPT TAKE 1 TABLET ON THURSDAY AND SUNDAY.  30 tablet  3   No current facility-administered medications for this visit.     Allergies:  No Known Allergies  Physical Exam: Blood pressure 149/49, pulse 55, temperature 97.3 F (36.3 C), temperature source Oral, resp. rate 18, height 5\' 10"  (1.778 m), weight 147 lb 14.4 oz (67.087 kg), SpO2 100.00%. ECOG: 1 General appearance: alert awake not in any distress. Head: Normocephalic, without obvious abnormality, atraumatic Neck: no adenopathy Lymph nodes: Cervical,  supraclavicular, and axillary nodes normal. Heart:regular rate and rhythm, S1, S2 normal, no murmur, click, rub or gallop Lung:chest clear, no wheezing, rales, normal symmetric air entry Abdomin: soft, non-tender, without masses or organomegaly EXT:no erythema, induration, or nodules   Lab Results: Lab Results  Component Value Date   WBC 6.0 08/24/2014   HGB 10.1* 08/24/2014   HCT 30.9* 08/24/2014   MCV 92.5 08/24/2014   PLT 187 08/24/2014     Chemistry      Component Value Date/Time   NA 138 05/25/2014 1536   NA 140 04/01/2014 1130   K 5.8* 05/25/2014 1536   K 5.2 04/01/2014 1130   CL 109 04/01/2014 1130   CL 114* 05/04/2013 0858   CO2 15* 05/25/2014 1536   CO2 20 04/01/2014 1130   BUN 46.9* 05/25/2014 1536   BUN 50* 04/01/2014 1130   CREATININE 2.6* 05/25/2014 1536   CREATININE 1.95* 04/08/2014 1239      Component Value Date/Time   CALCIUM 9.2 05/25/2014 1536   CALCIUM 9.4 04/01/2014 1130   ALKPHOS 107 05/25/2014 1536   AST 14 05/25/2014 1536   ALT 8 05/25/2014 1536   BILITOT 0.36 05/25/2014 1536       Impression and Plan:  80 year old gentleman with the following issues:  1. Stage IV adenocarcinoma of the bladder:  He is continued to refuse systemic chemotherapy for the time being. We can consider palliative radiation therapy if he develops persistent hematuria that is not affected he to local therapy and surgical intervention. He is planning to have a cystoscopy in the near future under the care of Dr. Era Bumpers.  2. Bony disease: He has very limited disease at L4 and he is asymptomatic from it. He develops back pain will obtain an MRI and address that area.  3. Hematuria: This has subsided. We can consider palliative radiation therapy if needed to. His hemoglobin appeared stable at this time.  4. History of prostate cancer: Diagnosed in 2013 Gleason score 3+3 equals 6 he is status post cryoablation his PSA is under control.  5. Followup: Will be in 3 months.  YQMVHQ,IONGE,  MD 10/7/20159:52 AM

## 2014-08-24 NOTE — Telephone Encounter (Signed)
Pt confirmed labs/ov per 10/07 POF, gave pt AVS.... KJ °

## 2014-08-25 ENCOUNTER — Encounter (HOSPITAL_COMMUNITY): Payer: Self-pay | Admitting: Pharmacy Technician

## 2014-08-25 ENCOUNTER — Encounter (HOSPITAL_COMMUNITY): Payer: Self-pay

## 2014-08-26 ENCOUNTER — Telehealth: Payer: Self-pay | Admitting: Cardiology

## 2014-08-26 NOTE — Telephone Encounter (Signed)
New message    Fax request sent on  9/29 . Status of cardiac clearance . Need to hold coumadin.

## 2014-08-26 NOTE — Telephone Encounter (Signed)
No cardiac clearance and hold for anti-coagulation noted in pts chart.  No fax noted in Dr Marlou Porch mailbox.  LM to [facility] to refax request to [phone].  Will forward message to Dr.Skains and primary nurse Pam to follow-up with on Monday.

## 2014-08-29 ENCOUNTER — Encounter (HOSPITAL_COMMUNITY)
Admission: RE | Admit: 2014-08-29 | Discharge: 2014-08-29 | Disposition: A | Discharge status: Home / Self Care | Payer: Medicare Other | Source: Ambulatory Visit | Attending: Urology | Admitting: Urology

## 2014-08-29 ENCOUNTER — Encounter (HOSPITAL_COMMUNITY): Payer: Self-pay

## 2014-08-29 DIAGNOSIS — R351 Nocturia: Secondary | ICD-10-CM | POA: Diagnosis not present

## 2014-08-29 DIAGNOSIS — N400 Enlarged prostate without lower urinary tract symptoms: Secondary | ICD-10-CM | POA: Diagnosis not present

## 2014-08-29 DIAGNOSIS — I499 Cardiac arrhythmia, unspecified: Secondary | ICD-10-CM | POA: Diagnosis not present

## 2014-08-29 DIAGNOSIS — M109 Gout, unspecified: Secondary | ICD-10-CM | POA: Diagnosis not present

## 2014-08-29 DIAGNOSIS — Z8619 Personal history of other infectious and parasitic diseases: Secondary | ICD-10-CM | POA: Insufficient documentation

## 2014-08-29 DIAGNOSIS — I2510 Atherosclerotic heart disease of native coronary artery without angina pectoris: Secondary | ICD-10-CM | POA: Insufficient documentation

## 2014-08-29 DIAGNOSIS — M19019 Primary osteoarthritis, unspecified shoulder: Secondary | ICD-10-CM | POA: Insufficient documentation

## 2014-08-29 DIAGNOSIS — Z0000 Encounter for general adult medical examination without abnormal findings: Secondary | ICD-10-CM | POA: Insufficient documentation

## 2014-08-29 DIAGNOSIS — Z7901 Long term (current) use of anticoagulants: Secondary | ICD-10-CM | POA: Insufficient documentation

## 2014-08-29 DIAGNOSIS — I480 Paroxysmal atrial fibrillation: Secondary | ICD-10-CM | POA: Diagnosis not present

## 2014-08-29 DIAGNOSIS — Z8546 Personal history of malignant neoplasm of prostate: Secondary | ICD-10-CM | POA: Insufficient documentation

## 2014-08-29 DIAGNOSIS — H04129 Dry eye syndrome of unspecified lacrimal gland: Secondary | ICD-10-CM | POA: Diagnosis not present

## 2014-08-29 DIAGNOSIS — K5790 Diverticulosis of intestine, part unspecified, without perforation or abscess without bleeding: Secondary | ICD-10-CM | POA: Insufficient documentation

## 2014-08-29 DIAGNOSIS — Z6820 Body mass index (BMI) 20.0-20.9, adult: Secondary | ICD-10-CM | POA: Insufficient documentation

## 2014-08-29 DIAGNOSIS — C679 Malignant neoplasm of bladder, unspecified: Secondary | ICD-10-CM | POA: Diagnosis not present

## 2014-08-29 DIAGNOSIS — R011 Cardiac murmur, unspecified: Secondary | ICD-10-CM | POA: Insufficient documentation

## 2014-08-29 DIAGNOSIS — I10 Essential (primary) hypertension: Secondary | ICD-10-CM | POA: Insufficient documentation

## 2014-08-29 DIAGNOSIS — I4510 Unspecified right bundle-branch block: Secondary | ICD-10-CM | POA: Insufficient documentation

## 2014-08-29 DIAGNOSIS — R634 Abnormal weight loss: Secondary | ICD-10-CM | POA: Diagnosis not present

## 2014-08-29 HISTORY — DX: Dry eye syndrome of bilateral lacrimal glands: H04.123

## 2014-08-29 HISTORY — DX: Abnormal weight loss: R63.4

## 2014-08-29 HISTORY — DX: Unspecified urinary incontinence: R32

## 2014-08-29 LAB — BASIC METABOLIC PANEL
ANION GAP: 13 (ref 5–15)
BUN: 44 mg/dL — ABNORMAL HIGH (ref 6–23)
CALCIUM: 9.1 mg/dL (ref 8.4–10.5)
CO2: 18 meq/L — AB (ref 19–32)
Chloride: 111 mEq/L (ref 96–112)
Creatinine, Ser: 2.27 mg/dL — ABNORMAL HIGH (ref 0.50–1.35)
GFR, EST AFRICAN AMERICAN: 27 mL/min — AB (ref >90)
GFR, EST NON AFRICAN AMERICAN: 24 mL/min — AB (ref >90)
Glucose, Bld: 117 mg/dL — ABNORMAL HIGH (ref 70–99)
Potassium: 5.6 mEq/L — ABNORMAL HIGH (ref 3.7–5.3)
SODIUM: 142 meq/L (ref 137–147)

## 2014-08-29 LAB — CBC
HCT: 30 % — ABNORMAL LOW (ref 39.0–52.0)
Hemoglobin: 10 g/dL — ABNORMAL LOW (ref 13.0–17.0)
MCH: 31.2 pg (ref 26.0–34.0)
MCHC: 33.3 g/dL (ref 30.0–36.0)
MCV: 93.5 fL (ref 78.0–100.0)
Platelets: 207 10*3/uL (ref 150–400)
RBC: 3.21 MIL/uL — AB (ref 4.22–5.81)
RDW: 14.2 % (ref 11.5–15.5)
WBC: 7 10*3/uL (ref 4.0–10.5)

## 2014-08-29 LAB — PROTIME-INR
INR: 2.2 — ABNORMAL HIGH (ref 0.00–1.49)
Prothrombin Time: 24.6 seconds — ABNORMAL HIGH (ref 11.6–15.2)

## 2014-08-29 LAB — APTT: aPTT: 52 seconds — ABNORMAL HIGH (ref 24–37)

## 2014-08-29 NOTE — Patient Instructions (Addendum)
22 Terry [name]  08/29/2014   Your procedure is scheduled on: Monday 09/05/14  Report to [facility] at 09:15 AM.  Call this number if you have problems the morning of surgery [phone]: [phone]   Remember:   Do not eat food or drink liquids After Midnight.     Take these medicines the morning of surgery with A SIP OF WATER: clonidine, eye drops if needed, trimpex, allopurinol   Do not wear jewelry, make-up or nail polish.  Do not wear lotions, powders, or perfumes.   Do not shave 48 hours prior to surgery. Men may shave face and neck.  Do not bring valuables to the hospital.  Contacts, dentures or bridgework may not be worn into surgery.  Leave suitcase in the car. After surgery it may be brought to your room.  For patients admitted to the hospital, checkout time is 11:00 AM the day of discharge.  Paulette Blanch, RN  pre op nurse call if needed [phone]    [facility] - Preparing for Surgery Before surgery, you can play an important role.  Because skin is not sterile, your skin needs to be as free of germs as possible.  You can reduce the number of germs on your skin by washing with CHG (chlorahexidine gluconate) soap before surgery.  CHG is an antiseptic cleaner which kills germs and bonds with the skin to continue killing germs even after washing. Please DO NOT use if you have an allergy to CHG or antibacterial soaps.  If your skin becomes reddened/irritated stop using the CHG and inform your nurse when you arrive at Short Stay. Do not shave (including legs and underarms) for at least 48 hours prior to the first CHG shower.  You may shave your face/neck. Please follow these instructions carefully:  1.  Shower with CHG Soap the night before surgery and the  morning of Surgery.  2.  If you choose to wash your hair, wash your hair first as usual with your  normal  shampoo.  3.  After you shampoo, rinse your hair and body thoroughly to remove the  shampoo.                             4.  Use CHG as you would any other liquid soap.  You can apply chg directly  to the skin and wash                       Gently with a scrungie or clean washcloth.  5.  Apply the CHG Soap to your body ONLY FROM THE NECK DOWN.   Do not use on face/ open                           Wound or open sores. Avoid contact with eyes, ears mouth and genitals (private parts).                       Wash face,  Genitals (private parts) with your normal soap.             6.  Wash thoroughly, paying special attention to the area where your surgery  will be performed.  7.  Thoroughly rinse your body with warm water from the neck down.  8.  DO NOT shower/wash with your normal soap after using and rinsing off  the CHG Soap.                9.  Pat yourself dry with a clean towel.            10.  Wear clean pajamas.            11.  Place clean sheets on your bed the night of your first shower and do not  sleep with pets. Day of Surgery : Do not apply any lotions/deodorants the morning of surgery.  Please wear clean clothes to the hospital/surgery center.  FAILURE TO FOLLOW THESE INSTRUCTIONS MAY RESULT IN THE CANCELLATION OF YOUR SURGERY PATIENT SIGNATURE_________________________________  NURSE SIGNATURE__________________________________  ________________________________________________________________________

## 2014-08-29 NOTE — Progress Notes (Signed)
Chest x-ray 04/01/14 on [facility], EKG 09/13/13 on [facility], CBC with diff and CMET results on [facility] 08/24/14-abnormal will repeat

## 2014-08-30 NOTE — Telephone Encounter (Signed)
Follow up    Surgery on 10/19.   Need to stop coumadin  3-5 days prior.

## 2014-08-30 NOTE — Telephone Encounter (Signed)
Faxed this note to [facility].

## 2014-08-30 NOTE — Telephone Encounter (Signed)
Patient  Ok to stop Coumadin 5 days prior to surgery, per Ignacia Bayley, PA

## 2014-08-31 IMAGING — CR DG CHEST 2V
2 series · 2 of 2 positions shown · non-contrast
Comparison: preop Bladder cancer

CLINICAL DATA: Preop evaluation

EXAM:
CHEST  2 VIEW

[w chest pa]
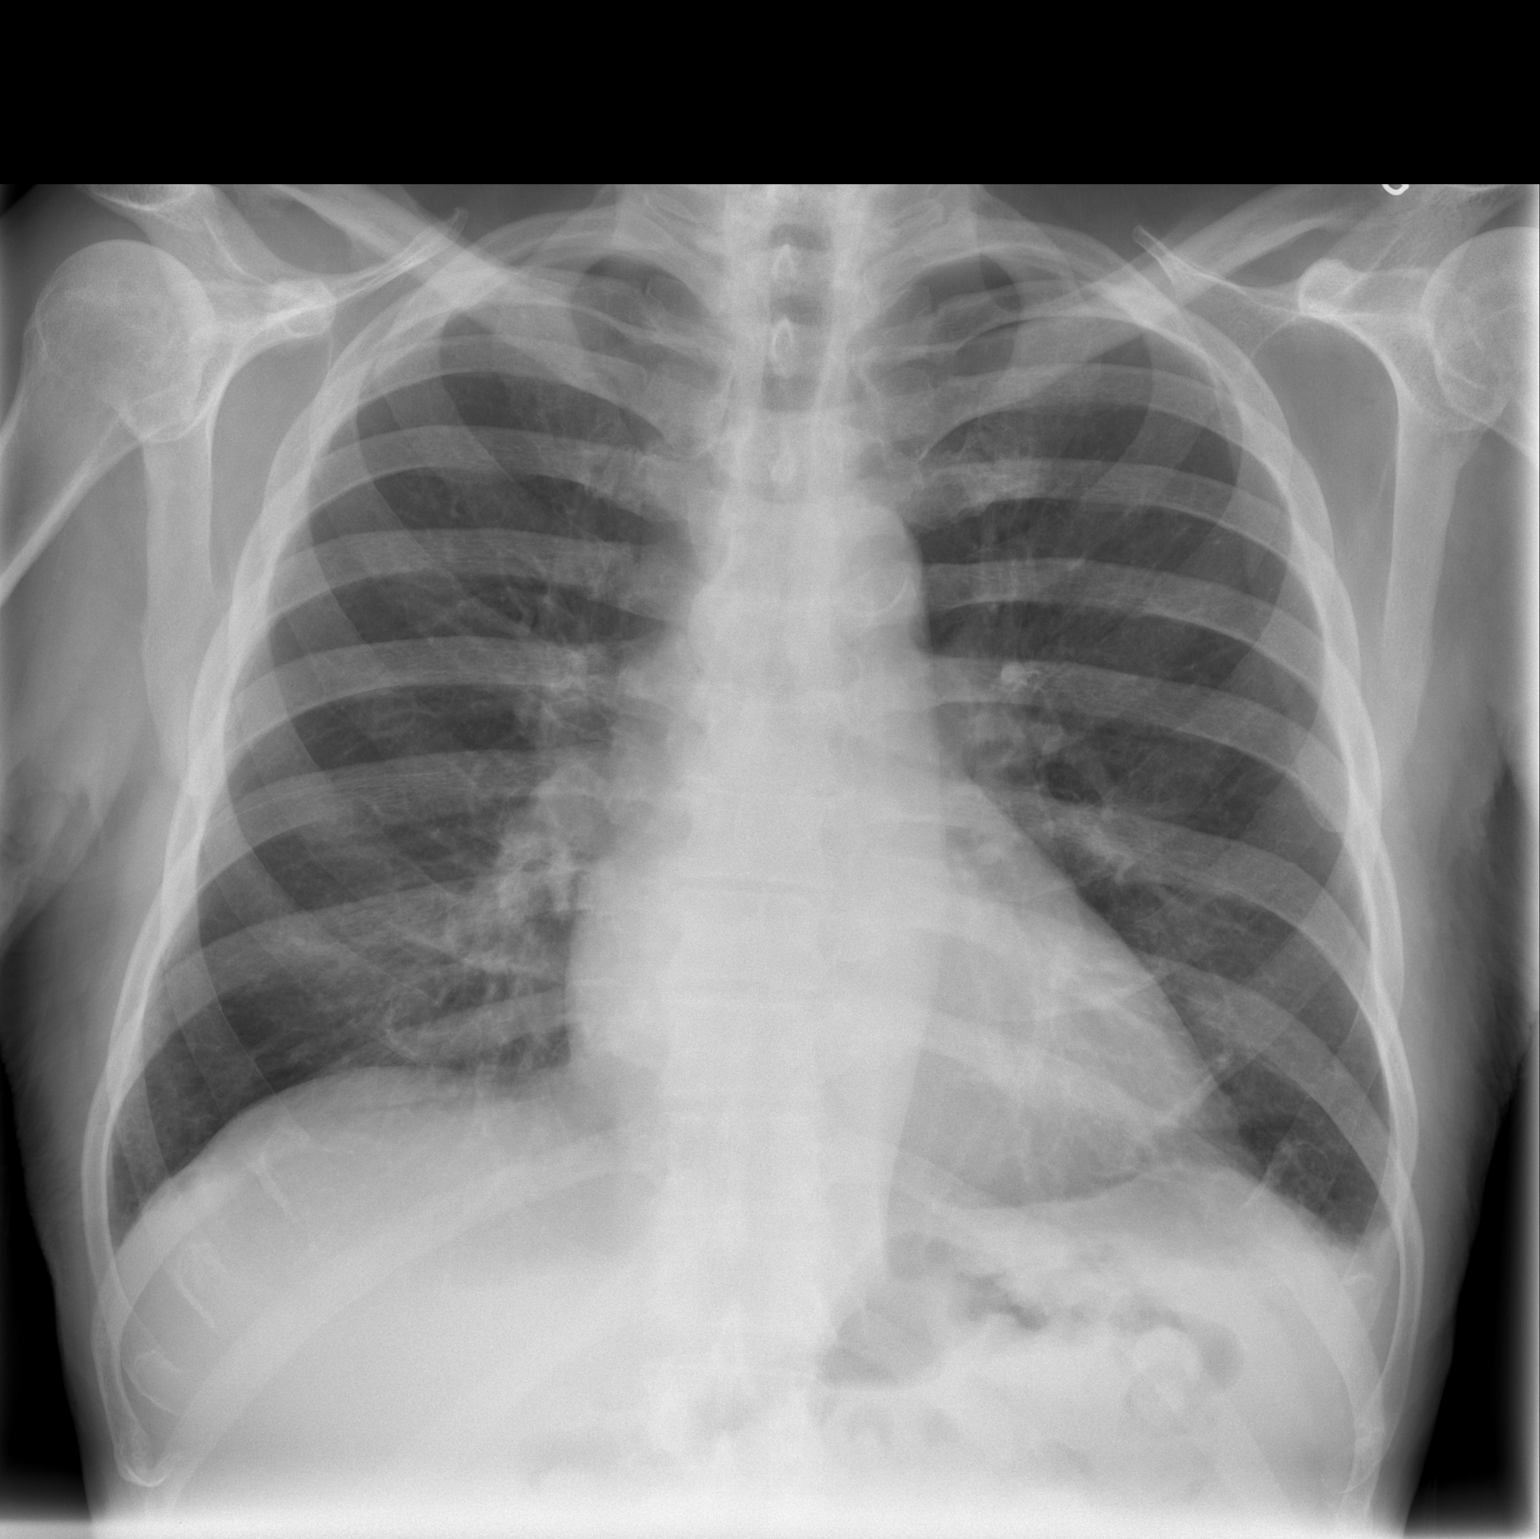

[w chest lat]
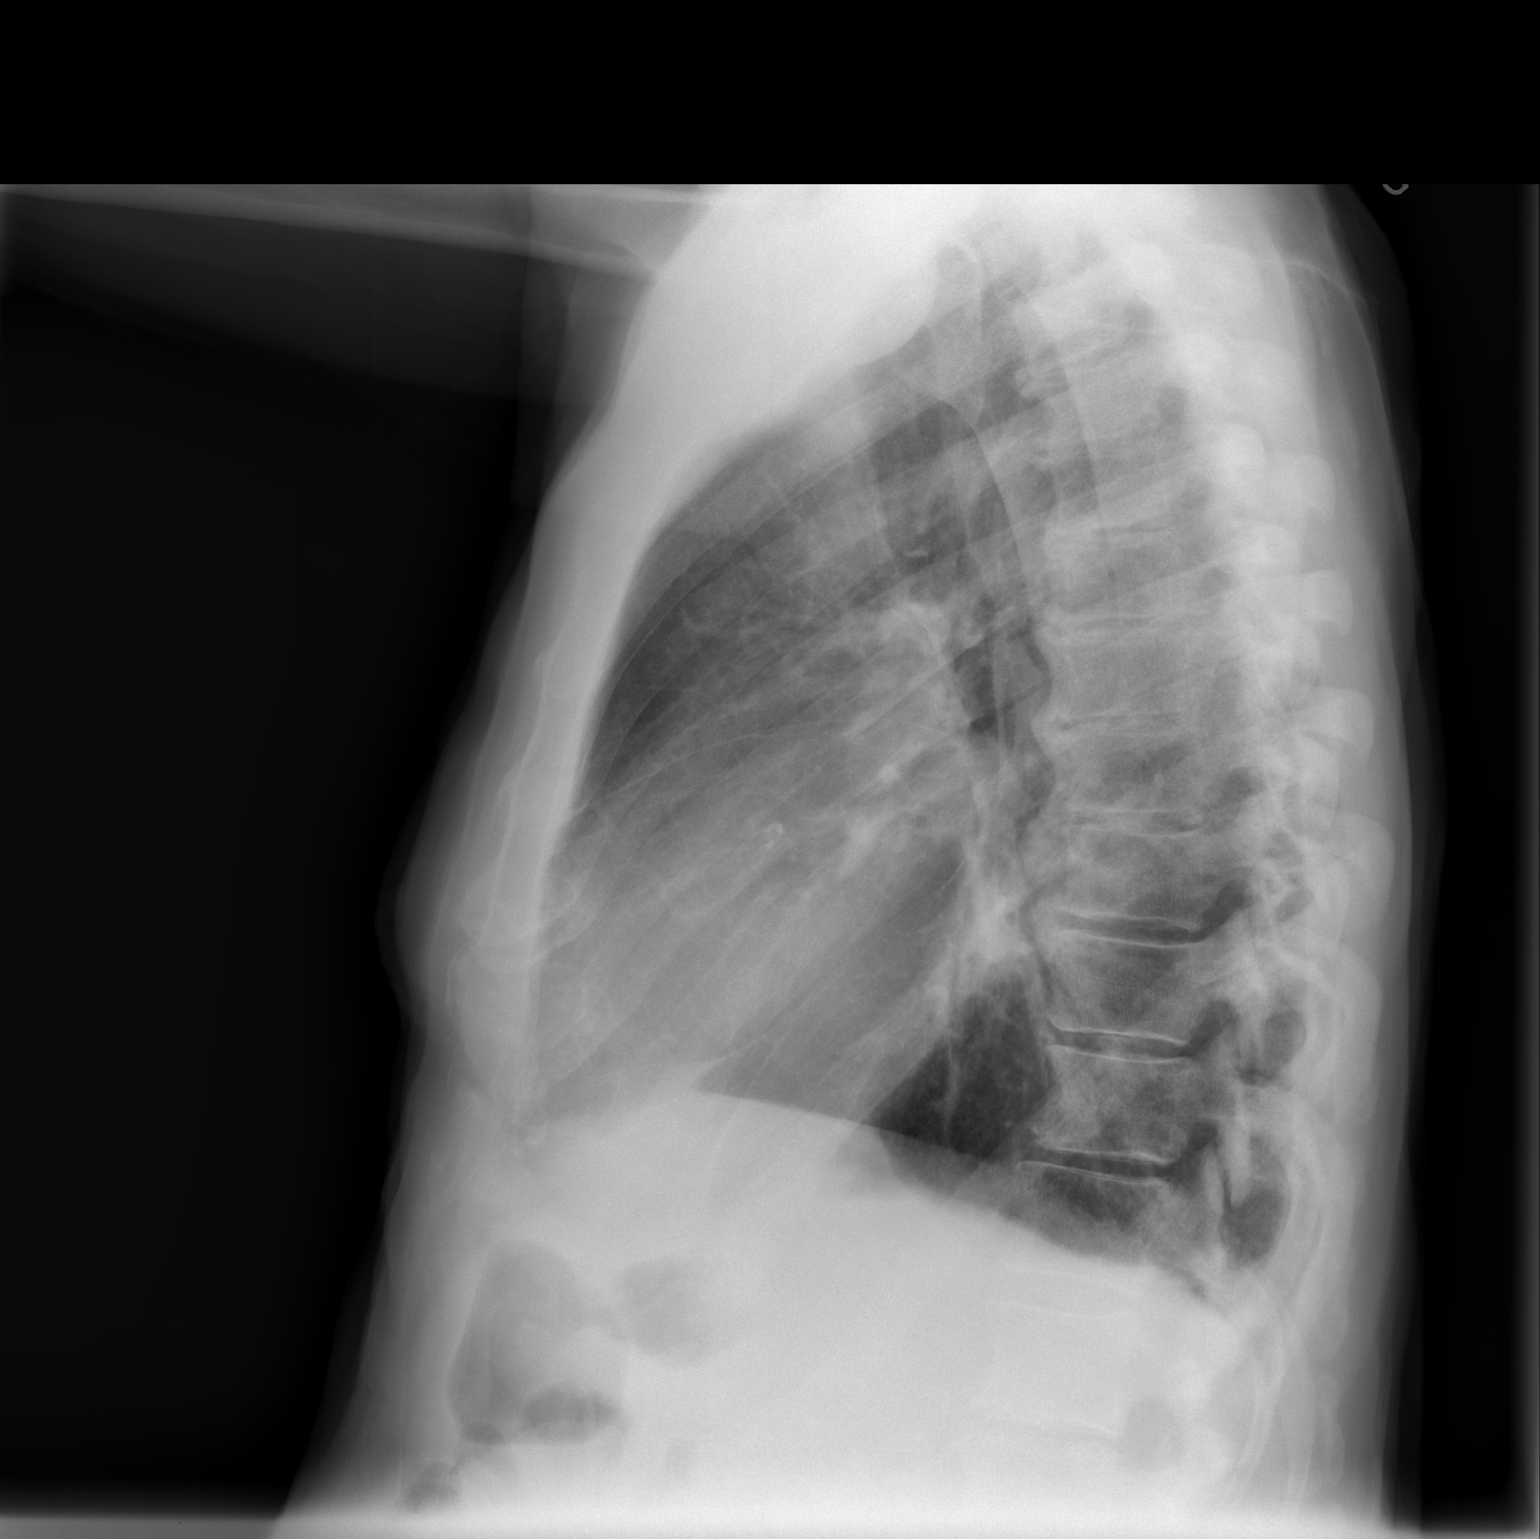

[2 of 2 positions shown; findings below may reference images not displayed]

FINDINGS: The cardiac silhouette is mild-to-moderately enlarged. There is
blunting of the left costophrenic angle. There is minimal increased
density left lung base. No further focal regions of consolidation or
focal infiltrates. Atherosclerotic calcifications within the aortic
arch. Mild degenerative changes right humeral head. Chronic changes
distal right clavicle.
IMPRESSION: Minimal atelectasis versus infiltrate left lung base.

Small left effusion.

## 2014-09-05 ENCOUNTER — Encounter (HOSPITAL_COMMUNITY)
Admission: RE | Disposition: A | Discharge status: Home / Self Care | Payer: Self-pay | Source: Ambulatory Visit | Attending: Urology

## 2014-09-05 ENCOUNTER — Inpatient Hospital Stay (HOSPITAL_COMMUNITY): Payer: Medicare Other | Admitting: Anesthesiology

## 2014-09-05 ENCOUNTER — Observation Stay (HOSPITAL_COMMUNITY)
Admission: RE | Admit: 2014-09-05 | Discharge: 2014-09-06 | Disposition: A | Discharge status: Home / Self Care | Payer: Medicare Other | Source: Ambulatory Visit | Attending: Urology | Admitting: Urology

## 2014-09-05 ENCOUNTER — Encounter (HOSPITAL_COMMUNITY): Payer: Self-pay | Admitting: *Deleted

## 2014-09-05 ENCOUNTER — Encounter (HOSPITAL_COMMUNITY): Payer: Medicare Other | Admitting: Anesthesiology

## 2014-09-05 DIAGNOSIS — Z7982 Long term (current) use of aspirin: Secondary | ICD-10-CM | POA: Insufficient documentation

## 2014-09-05 DIAGNOSIS — Z79899 Other long term (current) drug therapy: Secondary | ICD-10-CM | POA: Diagnosis not present

## 2014-09-05 DIAGNOSIS — I4891 Unspecified atrial fibrillation: Secondary | ICD-10-CM | POA: Insufficient documentation

## 2014-09-05 DIAGNOSIS — J45909 Unspecified asthma, uncomplicated: Secondary | ICD-10-CM | POA: Insufficient documentation

## 2014-09-05 DIAGNOSIS — I252 Old myocardial infarction: Secondary | ICD-10-CM | POA: Diagnosis not present

## 2014-09-05 DIAGNOSIS — K219 Gastro-esophageal reflux disease without esophagitis: Secondary | ICD-10-CM | POA: Diagnosis not present

## 2014-09-05 DIAGNOSIS — I10 Essential (primary) hypertension: Secondary | ICD-10-CM | POA: Diagnosis not present

## 2014-09-05 DIAGNOSIS — Z87891 Personal history of nicotine dependence: Secondary | ICD-10-CM | POA: Diagnosis not present

## 2014-09-05 DIAGNOSIS — Z7901 Long term (current) use of anticoagulants: Secondary | ICD-10-CM | POA: Diagnosis not present

## 2014-09-05 DIAGNOSIS — Z8546 Personal history of malignant neoplasm of prostate: Secondary | ICD-10-CM | POA: Insufficient documentation

## 2014-09-05 DIAGNOSIS — C679 Malignant neoplasm of bladder, unspecified: Secondary | ICD-10-CM | POA: Diagnosis not present

## 2014-09-05 DIAGNOSIS — M109 Gout, unspecified: Secondary | ICD-10-CM | POA: Diagnosis not present

## 2014-09-05 DIAGNOSIS — N529 Male erectile dysfunction, unspecified: Secondary | ICD-10-CM | POA: Insufficient documentation

## 2014-09-05 DIAGNOSIS — C678 Malignant neoplasm of overlapping sites of bladder: Secondary | ICD-10-CM | POA: Diagnosis present

## 2014-09-05 DIAGNOSIS — I2510 Atherosclerotic heart disease of native coronary artery without angina pectoris: Secondary | ICD-10-CM | POA: Insufficient documentation

## 2014-09-05 HISTORY — PX: TRANSURETHRAL RESECTION OF BLADDER TUMOR WITH GYRUS (TURBT-GYRUS): SHX6458

## 2014-09-05 LAB — PROTIME-INR
INR: 1.54 — ABNORMAL HIGH (ref 0.00–1.49)
Prothrombin Time: 18.6 seconds — ABNORMAL HIGH (ref 11.6–15.2)

## 2014-09-05 LAB — POCT I-STAT 4, (NA,K, GLUC, HGB,HCT)
Glucose, Bld: 89 mg/dL (ref 70–99)
HEMATOCRIT: 27 % — AB (ref 39.0–52.0)
HEMOGLOBIN: 9.2 g/dL — AB (ref 13.0–17.0)
POTASSIUM: 3.7 meq/L (ref 3.7–5.3)
SODIUM: 137 meq/L (ref 137–147)

## 2014-09-05 SURGERY — TRANSURETHRAL RESECTION OF BLADDER TUMOR WITH GYRUS (TURBT-GYRUS)
Anesthesia: General

## 2014-09-05 MED ORDER — FENTANYL CITRATE 0.05 MG/ML IJ SOLN
INTRAMUSCULAR | Status: DC | PRN
  Administered 2014-09-05 (×2): 25 ug via INTRAVENOUS

## 2014-09-05 MED ORDER — ONDANSETRON HCL 4 MG/2ML IJ SOLN
INTRAMUSCULAR | Status: DC | PRN
  Administered 2014-09-05: 4 mg via INTRAVENOUS

## 2014-09-05 MED ORDER — CEFAZOLIN SODIUM-DEXTROSE 2-3 GM-% IV SOLR
2.0000 g | INTRAVENOUS | Status: AC
Start: 2014-09-05 — End: 2014-09-05
  Administered 2014-09-05: 2 g via INTRAVENOUS

## 2014-09-05 MED ORDER — LIDOCAINE HCL (CARDIAC) 20 MG/ML IV SOLN
INTRAVENOUS | Status: AC
  Filled 2014-09-05: qty 5

## 2014-09-05 MED ORDER — BELLADONNA ALKALOIDS-OPIUM 16.2-60 MG RE SUPP
RECTAL | Status: AC
  Filled 2014-09-05: qty 1

## 2014-09-05 MED ORDER — ALLOPURINOL 300 MG PO TABS
300.0000 mg | ORAL_TABLET | Freq: Every day | ORAL | Status: DC
  Administered 2014-09-06: 300 mg via ORAL
  Filled 2014-09-05: qty 1

## 2014-09-05 MED ORDER — ONDANSETRON HCL 4 MG/2ML IJ SOLN: 4.0000 mg | INTRAMUSCULAR | Status: DC | PRN

## 2014-09-05 MED ORDER — OXYCODONE-ACETAMINOPHEN 5-325 MG PO TABS: 1.0000 | ORAL_TABLET | ORAL | Status: DC | PRN

## 2014-09-05 MED ORDER — HYDROMORPHONE HCL 1 MG/ML IJ SOLN
0.5000 mg | INTRAMUSCULAR | Status: DC | PRN
  Administered 2014-09-05: 1 mg via INTRAVENOUS
  Filled 2014-09-05: qty 1

## 2014-09-05 MED ORDER — DEXAMETHASONE SODIUM PHOSPHATE 10 MG/ML IJ SOLN
INTRAMUSCULAR | Status: AC
  Filled 2014-09-05: qty 1

## 2014-09-05 MED ORDER — FENTANYL CITRATE 0.05 MG/ML IJ SOLN
25.0000 ug | INTRAMUSCULAR | Status: DC | PRN
  Administered 2014-09-05: 25 ug via INTRAVENOUS

## 2014-09-05 MED ORDER — ZOLPIDEM TARTRATE 5 MG PO TABS: 5.0000 mg | ORAL_TABLET | Freq: Every evening | ORAL | Status: DC | PRN

## 2014-09-05 MED ORDER — ACETAMINOPHEN 325 MG PO TABS: 650.0000 mg | ORAL_TABLET | ORAL | Status: DC | PRN

## 2014-09-05 MED ORDER — CIPROFLOXACIN HCL 500 MG PO TABS
500.0000 mg | ORAL_TABLET | Freq: Two times a day (BID) | ORAL | Status: DC
Start: 2014-09-05 — End: 2014-09-05

## 2014-09-05 MED ORDER — WARFARIN SODIUM 2 MG PO TABS
2.0000 mg | ORAL_TABLET | Freq: Every day | ORAL | Status: DC
  Administered 2014-09-06: 2 mg via ORAL
  Filled 2014-09-05: qty 1

## 2014-09-05 MED ORDER — ONDANSETRON HCL 4 MG/2ML IJ SOLN: 4.0000 mg | Freq: Once | INTRAMUSCULAR | Status: DC | PRN

## 2014-09-05 MED ORDER — BACITRACIN-NEOMYCIN-POLYMYXIN 400-5-5000 EX OINT: 1.0000 "application " | TOPICAL_OINTMENT | Freq: Three times a day (TID) | CUTANEOUS | Status: DC | PRN

## 2014-09-05 MED ORDER — WARFARIN - PHYSICIAN DOSING INPATIENT: Freq: Every day | Status: DC

## 2014-09-05 MED ORDER — FENTANYL CITRATE 0.05 MG/ML IJ SOLN
INTRAMUSCULAR | Status: AC
  Filled 2014-09-05: qty 2

## 2014-09-05 MED ORDER — SENNOSIDES-DOCUSATE SODIUM 8.6-50 MG PO TABS
2.0000 | ORAL_TABLET | Freq: Every day | ORAL | Status: DC
  Administered 2014-09-05: 2 via ORAL
  Filled 2014-09-05: qty 2

## 2014-09-05 MED ORDER — LISINOPRIL 20 MG PO TABS
20.0000 mg | ORAL_TABLET | Freq: Two times a day (BID) | ORAL | Status: DC
  Administered 2014-09-05 – 2014-09-06 (×2): 20 mg via ORAL
  Filled 2014-09-05 (×5): qty 1

## 2014-09-05 MED ORDER — SODIUM CHLORIDE 0.9 % IR SOLN
Status: DC | PRN
  Administered 2014-09-05: 9000 mL via INTRAVESICAL

## 2014-09-05 MED ORDER — PHENAZOPYRIDINE HCL 200 MG PO TABS
200.0000 mg | ORAL_TABLET | Freq: Three times a day (TID) | ORAL | Status: DC | PRN
  Filled 2014-09-05: qty 1

## 2014-09-05 MED ORDER — OXYBUTYNIN CHLORIDE 5 MG PO TABS: 5.0000 mg | ORAL_TABLET | Freq: Three times a day (TID) | ORAL | Status: DC | PRN

## 2014-09-05 MED ORDER — CIPROFLOXACIN HCL 250 MG PO TABS
250.0000 mg | ORAL_TABLET | Freq: Two times a day (BID) | ORAL | Status: DC
  Administered 2014-09-05: 250 mg via ORAL
  Filled 2014-09-05 (×2): qty 1

## 2014-09-05 MED ORDER — LOSARTAN POTASSIUM 50 MG PO TABS
50.0000 mg | ORAL_TABLET | Freq: Two times a day (BID) | ORAL | Status: DC
  Administered 2014-09-05 – 2014-09-06 (×3): 50 mg via ORAL
  Filled 2014-09-05 (×5): qty 1

## 2014-09-05 MED ORDER — PROPOFOL 10 MG/ML IV BOLUS
INTRAVENOUS | Status: AC
  Filled 2014-09-05: qty 20

## 2014-09-05 MED ORDER — SODIUM CHLORIDE 0.45 % IV SOLN
INTRAVENOUS | Status: DC
  Administered 2014-09-05: 15:00:00 via INTRAVENOUS

## 2014-09-05 MED ORDER — DIPHENHYDRAMINE HCL 50 MG/ML IJ SOLN: 12.5000 mg | Freq: Four times a day (QID) | INTRAMUSCULAR | Status: DC | PRN

## 2014-09-05 MED ORDER — POLYVINYL ALCOHOL 1.4 % OP SOLN
1.0000 [drp] | Freq: Two times a day (BID) | OPHTHALMIC | Status: DC
  Administered 2014-09-05 – 2014-09-06 (×3): 1 [drp] via OPHTHALMIC
  Filled 2014-09-05 (×3): qty 15

## 2014-09-05 MED ORDER — HYDROCODONE-ACETAMINOPHEN 10-325 MG PO TABS: 1.0000 | ORAL_TABLET | Freq: Four times a day (QID) | ORAL | Status: DC | PRN

## 2014-09-05 MED ORDER — ADULT MULTIVITAMIN W/MINERALS CH
1.0000 | ORAL_TABLET | Freq: Two times a day (BID) | ORAL | Status: DC
  Administered 2014-09-05 – 2014-09-06 (×2): 1 via ORAL
  Filled 2014-09-05 (×3): qty 1

## 2014-09-05 MED ORDER — SODIUM CHLORIDE 0.9 % IR SOLN
3000.0000 mL | Status: DC
  Administered 2014-09-05: 3000 mL

## 2014-09-05 MED ORDER — PROPOFOL 10 MG/ML IV BOLUS
INTRAVENOUS | Status: DC | PRN
  Administered 2014-09-05: 200 mg via INTRAVENOUS

## 2014-09-05 MED ORDER — LIDOCAINE HCL (CARDIAC) 20 MG/ML IV SOLN
INTRAVENOUS | Status: DC | PRN
  Administered 2014-09-05: 100 mg via INTRAVENOUS

## 2014-09-05 MED ORDER — HYDROCHLOROTHIAZIDE 25 MG PO TABS
25.0000 mg | ORAL_TABLET | Freq: Every day | ORAL | Status: DC
  Administered 2014-09-06: 25 mg via ORAL
  Filled 2014-09-05: qty 1

## 2014-09-05 MED ORDER — DIPHENHYDRAMINE HCL 12.5 MG/5ML PO ELIX: 12.5000 mg | ORAL_SOLUTION | Freq: Four times a day (QID) | ORAL | Status: DC | PRN

## 2014-09-05 MED ORDER — CEFAZOLIN SODIUM-DEXTROSE 2-3 GM-% IV SOLR
INTRAVENOUS | Status: AC
  Filled 2014-09-05: qty 50

## 2014-09-05 MED ORDER — ICAPS PO CAPS: 1.0000 | ORAL_CAPSULE | Freq: Two times a day (BID) | ORAL | Status: DC

## 2014-09-05 MED ORDER — ONDANSETRON HCL 4 MG/2ML IJ SOLN
INTRAMUSCULAR | Status: AC
  Filled 2014-09-05: qty 2

## 2014-09-05 MED ORDER — OXYBUTYNIN CHLORIDE 5 MG PO TABS
5.0000 mg | ORAL_TABLET | Freq: Three times a day (TID) | ORAL | Status: DC | PRN
  Administered 2014-09-05: 5 mg via ORAL
  Filled 2014-09-05: qty 1

## 2014-09-05 MED ORDER — LACTATED RINGERS IV SOLN
INTRAVENOUS | Status: DC
  Administered 2014-09-05: 1000 mL via INTRAVENOUS

## 2014-09-05 MED ORDER — DEXAMETHASONE SODIUM PHOSPHATE 10 MG/ML IJ SOLN
INTRAMUSCULAR | Status: DC | PRN
  Administered 2014-09-05: 10 mg via INTRAVENOUS

## 2014-09-05 MED ORDER — CLONIDINE HCL 0.2 MG PO TABS
0.2000 mg | ORAL_TABLET | Freq: Two times a day (BID) | ORAL | Status: DC
  Administered 2014-09-05 – 2014-09-06 (×2): 0.2 mg via ORAL
  Filled 2014-09-05 (×5): qty 1

## 2014-09-05 SURGICAL SUPPLY — 21 items
BAG URINE DRAINAGE (UROLOGICAL SUPPLIES) ×3 IMPLANT
BAG URO CATCHER STRL LF (DRAPE) ×3 IMPLANT
CATH HEMA 3WAY 30CC 22FR COUDE (CATHETERS) ×2 IMPLANT
CATH HEMATURIA 20FR (CATHETERS) ×3 IMPLANT
DRAPE CAMERA CLOSED 9X96 (DRAPES) ×3 IMPLANT
ELECT BUTTON HF 24-28F 2 30DE (ELECTRODE) IMPLANT
ELECT HF RESECT BIPO 24F 45 ND (CUTTING LOOP) IMPLANT
ELECT LOOP MED HF 24F 12D CBL (CLIP) ×2 IMPLANT
ELECT RESECT VAPORIZE 12D CBL (ELECTRODE) IMPLANT
GLOVE BIOGEL M STRL SZ7.5 (GLOVE) ×11 IMPLANT
GOWN STRL REUS W/TWL XL LVL3 (GOWN DISPOSABLE) ×7 IMPLANT
HOLDER FOLEY CATH W/STRAP (MISCELLANEOUS) ×2 IMPLANT
IV NS IRRIG 3000ML ARTHROMATIC (IV SOLUTION) ×1 IMPLANT
KIT ASPIRATION TUBING (SET/KITS/TRAYS/PACK) ×3 IMPLANT
MANIFOLD NEPTUNE II (INSTRUMENTS) ×3 IMPLANT
NS IRRIG 1000ML POUR BTL (IV SOLUTION) ×3 IMPLANT
PACK CYSTO (CUSTOM PROCEDURE TRAY) ×3 IMPLANT
SYR 30ML LL (SYRINGE) ×2 IMPLANT
SYRINGE IRR TOOMEY STRL 70CC (SYRINGE) ×3 IMPLANT
TUBING CONNECTING 10 (TUBING) ×2 IMPLANT
TUBING CONNECTING 10' (TUBING) ×1

## 2014-09-05 NOTE — Care Management Note (Addendum)
    Page 1 of 1   09/06/2014     1:37:29 PM CARE MANAGEMENT NOTE 09/06/2014  Patient:  Terry [name], Terry [name]   Account Number:  [id number]  Date Initiated:  09/05/2014  Documentation initiated by:  Dessa Phi  Subjective/Objective Assessment:   80 Y/O M ADMITTED W/BLADDER CA.     Action/Plan:   FROM HOME.   Anticipated DC Date:  09/06/2014   Anticipated DC Plan:  [place]  CM consult      Choice offered to / List presented to:             Status of service:  Completed, signed off Medicare Important Message given?   (If response is "NO", the following Medicare IM given date fields will be blank) Date Medicare IM given:   Medicare IM given by:   Date Additional Medicare IM given:   Additional Medicare IM given by:    Discharge Disposition:  HOME/SELF CARE  Per UR Regulation:  Reviewed for med. necessity/level of care/duration of stay  If discussed at [place] of Stay Meetings, dates discussed:    Comments:  09/05/14 Deston Bilyeu RN,BSN NCM [phone] S/P CYSTO/ TURBT. MONITOR FOR PROGRESS & D/C NEEDS.

## 2014-09-05 NOTE — Interval H&P Note (Signed)
History and Physical Interval Note:  09/05/2014 11:36 AM  Terry [name]  has presented today for surgery, with the diagnosis of bladder cancer  The various methods of treatment have been discussed with the patient and family. After consideration of risks, benefits and other options for treatment, the patient has consented to  Procedure(s): CYSTOSCOPY WITH TRANSURETHRAL RESECTION OF BLADDER TUMOR WITH GYRUS (TURBT-GYRUS) (N/A) as a surgical intervention .  The patient's history has been reviewed, patient examined, no change in status, stable for surgery.  I have reviewed the patient's chart and labs.  Questions were answered to the patient's satisfaction.     Carolan Clines I

## 2014-09-05 NOTE — Anesthesia Preprocedure Evaluation (Addendum)
Anesthesia Evaluation  Patient identified by MRN, date of birth, ID band Patient awake    Reviewed: Allergy & Precautions, H&P , NPO status , Patient's Chart, lab work & pertinent test results  History of Anesthesia Complications Negative for: history of anesthetic complications  Airway Mallampati: III TM Distance: >3 FB Neck ROM: Full    Dental no notable dental hx. (+) Dental Advisory Given   Pulmonary former smoker,  breath sounds clear to auscultation  Pulmonary exam normal       Cardiovascular hypertension, Pt. on medications + CAD + dysrhythmias Atrial Fibrillation Rhythm:Regular Rate:Normal     Neuro/Psych negative neurological ROS  negative psych ROS   GI/Hepatic negative GI ROS, Neg liver ROS,   Endo/Other  negative endocrine ROS  Renal/GU CRFRenal disease  negative genitourinary   Musculoskeletal  (+) Arthritis -, Osteoarthritis,    Abdominal   Peds negative pediatric ROS (+)  Hematology negative hematology ROS (+)   Anesthesia Other Findings   Reproductive/Obstetrics negative OB ROS                          Anesthesia Physical Anesthesia Plan  ASA: III  Anesthesia Plan: General   Post-op Pain Management:    Induction: Intravenous  Airway Management Planned: LMA  Additional Equipment:   Intra-op Plan:   Post-operative Plan: Extubation in OR  Informed Consent: I have reviewed the patients History and Physical, chart, labs and discussed the procedure including the risks, benefits and alternatives for the proposed anesthesia with the patient or authorized representative who has indicated his/her understanding and acceptance.   Dental advisory given  Plan Discussed with: CRNA  Anesthesia Plan Comments:         Anesthesia Quick Evaluation

## 2014-09-05 NOTE — Op Note (Signed)
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  Surgeon:  Chauncey Cruel. Gaynelle Arabian, MD  First assistant:  None  Anesthesia:  General LMA  Preparation:  After appropriate preanesthesia, the patient was brought to the operating room, placed on the operating table in the dorsal supine position where general LMA anesthesia was introduced. He was replaced in the dorsal lithotomy position with pubis was prepped with Betadine solution and draped in usual fashion. The arm band was double checked. The history was double checked.  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 Benign localized hyperplasia of prostate with urinary obstruction (481.85,631.49)   Assessed By: Alexis Frock (Urology); Last Assessed: 25 Jan 2013  3. Erectile dysfunction due to arterial insufficiency (607.84)  4. Prostate cancer (185)   Assessed By: Carolan Clines (Urology); Last Assessed: 09 Aug 2014  5. Urge incontinence of urine (788.31)   Assessed By: Carolan Clines (Urology); Last Assessed: 14 Jul 2014  History of Present Illness  80 YO male returns today for a 4 mo cystoscopy for hx of bladder cancer. He is s/p TUR-BT on 04/08/14. Pathology: invasive high grade carcinoma.  Hx of gross hematuria. He states that he stopped his Warfarin & gross hematuria resolved. He saw Steele Berg, NP on 01/17/14, w/ c/o 5-day hx of painless gross hematuria w/ occasional pieces of clots associated w/ urge incontinence. Blood was noted at the beginning of stream and clears up at the end of the stream. He is on 81mg  ASA and chronic warfarin for hx of afib. Last INR check was "4.3" eight days ago and had to have the warfarin dose reduced but  has not had a recheck of INR. He had one episode of gross hematuria 2 months ago during his every 2-3 month follow w/ Dr. Alen Blew, his oncologist, but no intervention needed since hematuria cleared up spontaneously. Denies any other associate signs/ symptoms. Denies loss of appetite or unintentional weight loss. Denies dizziness. He feels occasional incomplete bladder emptying.  He was seeing by a urologist at [facility] 8 months ago for 3rd opinion on cystectomy vs palliative treatment and was told that there are noting they could do for him and was predicted that he has 3-6 months to live. He sees his oncologist Dr. Alen Blew every 2-3 months for f/u but not on chemo due to lack of its effectiveness to this particular type of bladder cancer. 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 Gaynelle Arabian on workup for gross hematuria. Lesion at trigone, NOT bladder dome. Bone Scan 2/21014 normal. CT 07/2012 without distant disease. No h/o pelvic radiation or chronic catheter. Due to the aggressive nature of this cancer, he was seen by Dr. Tresa Moore for 2nd opinion on cystectomy vs palliative treatment. He was also referred to [facility] for 3rd opinion.  2 ) Prostate Cancer - s/p primary cryotherapy 01/2012 for Gleason 6 adenocarcinoma by Dr. Gaynelle Arabian. Pre-op PSA 1.8. Post op PSA 0.03. Therapy given as part of plan to help with LUTS which actually has improved.      Statement of  Likelihood of Success: Excellent. TIME-OUT observed.:  Procedure:  Cystourethroscopy was accomplished, and showed a postoperative prostate, status post cryotherapy. Large recurrent bladder cancer was identified, with a largest recurrence noted on the right side of the bladder. The tumor extended from the trigone to the bladder neck. This was  resected. Tumor extended from the trigone to the bladder neck, for the entire width of the trigone, and involved the entire bladder neck. This required extensive resection. The tissue was sent to laboratory together. (Greater than 10 cm of tumor second). Tissue is irrigated free from the bladder, and sent to laboratory. A 20 cm three-way hematuria Foley catheter was placed to irrigation. Traction may be placed later. The patient was awakened, and taken to recovery room in good condition.

## 2014-09-05 NOTE — H&P (Signed)
Reason For Visit 4 mo cystoscopy   Active Problems Problems  1. 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 Benign localized hyperplasia of prostate with urinary obstruction (342.87,681.15)   Assessed By: Alexis Frock (Urology); Last Assessed: 25 Jan 2013 3. Erectile dysfunction due to arterial insufficiency (607.84) 4. Prostate cancer (185)   Assessed By: Carolan Clines (Urology); Last Assessed: 09 Aug 2014 5. Urge incontinence of urine (788.31)   Assessed By: Carolan Clines (Urology); Last Assessed: 14 Jul 2014  History of Present Illness     80 YO male returns today for a 4 mo cystoscopy for hx of bladder cancer. He is s/p TUR-BT on 04/08/14. Pathology: invasive high grade carcinoma.     Hx of gross hematuria. He states that he stopped his Warfarin & gross hematuria resolved.  He saw Steele Berg, NP on 01/17/14, w/ c/o 5-day hx of painless gross hematuria w/ occasional pieces of clots associated w/ urge incontinence. Blood was noted at the beginning of stream and clears up at the end of the stream. He is on 81mg  ASA and chronic warfarin for hx of afib. Last INR check was "4.3" eight days ago and had to have the warfarin dose reduced but has not had a recheck of INR. He had one episode of gross hematuria 2 months ago during his every 2-3 month follow w/ Dr. Alen Blew, his oncologist, but no intervention needed since hematuria cleared up spontaneously. Denies any other associate signs/ symptoms. Denies loss of appetite or unintentional weight loss. Denies dizziness. He feels occasional incomplete bladder emptying.     He was seeing by a urologist at [facility] 8 months ago for 3rd opinion on cystectomy vs palliative treatment and was told that there are noting they could do for him and was predicted that he has 3-6 months to live.  He sees his oncologist Dr. Alen Blew every 2-3 months for f/u but not on chemo due to lack of its effectiveness to this particular type of bladder  cancer.  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 Gaynelle Arabian on workup for gross hematuria. Lesion at trigone, NOT bladder dome. Bone Scan 2/21014 normal. CT 07/2012 without distant disease. No h/o pelvic radiation or chronic catheter. Due to the aggressive nature of this cancer, he was seen by Dr. Tresa Moore for 2nd opinion on cystectomy vs palliative treatment. He was also referred to [facility] for 3rd opinion.    2 ) Prostate Cancer - s/p primary cryotherapy 01/2012 for Gleason 6 adenocarcinoma by Dr. Gaynelle Arabian. Pre-op PSA 1.8. Post op PSA 0.03. Therapy given as part of plan to help with LUTS which actually has improved.   Past Medical History Problems  1. History of Acute Myocardial Infarction (V12.59) 2. History of Asthma (493.90) 3. History of Atrial fibrillation (427.31) 4. History of Gout (274.9) 5. History of esophageal reflux (V12.79) 6. History of hepatitis (V12.09) 7. History of hypertension (V12.59) 8. History of Pancreatitis (577.9)  Surgical History Problems  1. History of Cystoscopy With Fulguration Large Lesion (Over 5cm) 2. History of Cystoscopy With Fulguration Medium Lesion (2-5cm) 3. History of No Surgical Problems 4. History of Surgery Prostate Cryosurgical Ablation  Current Meds 1. Allopurinol 300 MG Oral Tablet;  Therapy: (Recorded:16Sep2013) to Recorded 2. Aspirin 81 MG Oral  Tablet;  Therapy: (Recorded:18Jul2012) to Recorded 3. Azelastine HCl SOLN;  Therapy: (Recorded:16Sep2013) to Recorded 4. CloNIDine HCl - 0.1 MG Oral Tablet;  Therapy: (Recorded:05Dec2012) to Recorded 5. Hydrochlorothiazide 25 MG Oral Tablet;  Therapy: (Recorded:16Sep2013) to Recorded 6. ICaps CAPS;  Therapy: (Recorded:13Aug2012) to Recorded 7. Lisinopril 40 MG Oral Tablet;   Therapy: (Recorded:18Jul2012) to Recorded 8. Losartan Potassium 50 MG Oral Tablet;  Therapy: (Recorded:16Sep2013) to Recorded 9. Myrbetriq 50 MG Oral Tablet Extended Release 24 Hour; TAKE 1 TABLET ONCE DAILY;  Therapy: [id number] to (Last Rx:13Apr2015)  Requested for: 13Apr2015 Ordered 10. Oxybutynin Chloride 5 MG Oral Tablet; TAKE 1 TABLET Every 8 hours PRN;   Therapy: (Recorded:10Mar2014) to Recorded 11. Trimethoprim 100 MG Oral Tablet; TAKE 1 TABLET ONCE DAILY;   Therapy: [id number] to (Last Rx:13Jul2015)  Requested for: 30SPQ3300 Ordered 12. Warfarin Sodium 2 MG Oral Tablet;   Therapy: (Recorded:18Jul2012) to Recorded  Allergies Medication  1. No Known Drug Allergies  Family History Problems  1. Family history of Family Health Status Number Of Children   2 sons 2. Family history of Father Deceased At Age 78   stroke 3. Family history of Mother Deceased At Age 50   natural causes 4. Family history of Transient Ischemic Attack : Mother 5. Family history of Transient Ischemic Attack : Father  Social History Problems  1. Alcohol Use   4 per day 2. Caffeine Use   1 per day 3. Former smoker (V15.82)   quit 69yrs ago 4. Marital History - Currently Married 5. Marital History - Widowed 6. Occupation: Retired  Review of Systems  Genitourinary: feelings of urinary urgency, incomplete emptying of bladder and hematuria, but no urinary frequency, no dysuria, no difficulty starting the urinary stream, no post-void dribbling and initiating urination does not require straining.    Vitals Vital Signs [Data Includes: Last 1 Day]  Recorded: 22Sep2015 11:31AM  Blood Pressure: 117 / 56 Heart Rate: 47  Physical Exam Constitutional: Well nourished and well developed . No acute distress. The patient appears well hydrated. Thin w male in NAD.  ENT:. Examination of the teeth show poor dentition. Hearing loss is noted.  Neck: The appearance of the neck is normal.  Pulmonary: No  respiratory distress.  Cardiovascular:. No peripheral edema.  Abdomen: The abdomen is flat. No hernias are palpable.  Rectal: Rectal exam demonstrates decreased sphincter tone. Estimated prostate size is 4+. The prostate is indurated involving the entire left lobe of the prostate which appears to be confined within the prostate capsule, is not tender and is not fluctuant. The left seminal vesicle is nonpalpable. The right seminal vesicle is nonpalpable.  Genitourinary: Examination of the penis demonstrates no discharge, no masses, no lesions and a normal meatus. The penis is circumcised. The scrotum is without lesions. The right epididymis is palpably normal and non-tender. The left epididymis is palpably normal and non-tender. The right testis is non-tender and without masses. The left testis is non-tender and without masses.  Lymphatics: The femoral and inguinal nodes are not enlarged or tender.  Skin: Normal skin turgor, no visible rash and no visible skin lesions.  Neuro/Psych:. Mood and affect are appropriate.    Procedure  Procedure: Cystoscopy  Chaperone Present: kinm lewis.  Indication: History of Urothelial Carcinoma.  Informed Consent: Risks, benefits, and potential adverse events were discussed and informed consent was obtained from the patient.  Prep: The patient was prepped with betadine.  Anesthesia:. Local anesthesia was administered intraurethrally with 2% lidocaine jelly.  Antibiotic prophylaxis: Ciprofloxacin.  Procedure Note:  Urethral meatus:. No abnormalities.  Anterior urethra: No abnormalities.  Prostatic urethra: No abnormalities.  Bladder: Visulization was clear. Examination of the bladder demonstrated clot within the bladder, but no trabeculation and no diverticulum erythematous mucosa and edema located on the anterior aspect, on the right side of the bladder , but no ulcer and no cellules. Multiple tumors were identified in the bladder. A papillary tumor was seen in  the bladder measuring approximately 4 cm in size. This tumor was located on the right side, on the anterior aspect, near the trigone of the bladder. Another papillary tumor was seen in the bladder measuring approximately 2 cm in size. This tumor was located on the right side, on the anterior aspect, at the base of the bladder. The patient tolerated the procedure well.  Complications: None.    Assessment Assessed  1. 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 Gross hematuria (599.71) 3. Prostate cancer (185)  Recurrent bladder cancer, wit at least 2 tumors identifies, with necrosis. Pt acknowledges gross hematuria and clot formation. he will be scheduled for TUR-BT. he is going to a wedding in 2 weeks, and we will have surgery after that. He has asked how long he might have to live. We have laughed that 2 yrs ago, [place] only gave him 2 months to live. We also discussed how i have tried to have him have a robotic cystectomy, but this was cancelled because of finding of metastatic disease. he is offered Hospice care, but steadfastly refuses.. he remains at home wit his 2 sons, and is well cared for.   Plan Adenocarcinoma of bladder, stage 4, Gross hematuria, Hematuria, Prostate cancer, Urge incontinence of urine  1. Follow-up NP/PA [facility]  Follow-up  Status: Active  Requested for: 22Sep2015  Schedule surgery for TUR-BT. for local control of his bladder cancer, after he sees Dr. Alen Blew.   Discussion/Summary   cc: Dr. Rosaria Ferries @ [facility]   cc: Dr. Alen Blew     Signatures Electronically signed by : Carolan Clines, M.D.; Aug 09 2014  1:15PM EST

## 2014-09-05 NOTE — Transfer of Care (Signed)
Immediate Anesthesia Transfer of Care Note  Patient: Terry [name]  Procedure(s) Performed: Procedure(s): CYSTOSCOPY WITH TRANSURETHRAL RESECTION OF BLADDER TUMOR WITH GYRUS (TURBT-GYRUS) (N/A)  Patient Location: PACU  Anesthesia Type:General  Level of Consciousness: sedated  Airway & Oxygen Therapy: Patient Spontanous Breathing and Patient connected to face mask oxygen  Post-op Assessment: Report given to PACU RN and Post -op Vital signs reviewed and stable  Post vital signs: Reviewed and stable  Complications: No apparent anesthesia complications

## 2014-09-05 NOTE — Anesthesia Postprocedure Evaluation (Signed)
  Anesthesia Post-op Note  Patient: Terry [name]  Procedure(s) Performed: Procedure(s) (LRB): CYSTOSCOPY WITH TRANSURETHRAL RESECTION OF BLADDER TUMOR WITH GYRUS (TURBT-GYRUS) (N/A)  Patient Location: PACU  Anesthesia Type: General  Level of Consciousness: awake and alert   Airway and Oxygen Therapy: Patient Spontanous Breathing  Post-op Pain: mild  Post-op Assessment: Post-op Vital signs reviewed, Patient's Cardiovascular Status Stable, Respiratory Function Stable, Patent Airway and No signs of Nausea or vomiting  Last Vitals:  Filed Vitals:   09/05/14 1245  BP: 142/67  Pulse: 65  Temp:   Resp: 18    Post-op Vital Signs: stable   Complications: No apparent anesthesia complications

## 2014-09-06 ENCOUNTER — Encounter (HOSPITAL_COMMUNITY): Payer: Self-pay | Admitting: Urology

## 2014-09-06 DIAGNOSIS — C679 Malignant neoplasm of bladder, unspecified: Secondary | ICD-10-CM | POA: Diagnosis not present

## 2014-09-06 LAB — BASIC METABOLIC PANEL
Anion gap: 16 — ABNORMAL HIGH (ref 5–15)
BUN: 67 mg/dL — AB (ref 6–23)
CHLORIDE: 105 meq/L (ref 96–112)
CO2: 16 mEq/L — ABNORMAL LOW (ref 19–32)
Calcium: 8.5 mg/dL (ref 8.4–10.5)
Creatinine, Ser: 3.11 mg/dL — ABNORMAL HIGH (ref 0.50–1.35)
GFR calc Af Amer: 19 mL/min — ABNORMAL LOW (ref >90)
GFR calc non Af Amer: 16 mL/min — ABNORMAL LOW (ref >90)
GLUCOSE: 141 mg/dL — AB (ref 70–99)
POTASSIUM: 4.7 meq/L (ref 3.7–5.3)
Sodium: 137 mEq/L (ref 137–147)

## 2014-09-06 LAB — HEMOGLOBIN AND HEMATOCRIT, BLOOD
HEMATOCRIT: 25.9 % — AB (ref 39.0–52.0)
Hemoglobin: 8.9 g/dL — ABNORMAL LOW (ref 13.0–17.0)

## 2014-09-06 MED ORDER — PHENAZOPYRIDINE HCL 200 MG PO TABS: 200.0000 mg | ORAL_TABLET | Freq: Three times a day (TID) | ORAL | Status: AC | PRN

## 2014-09-06 MED ORDER — TRAMADOL-ACETAMINOPHEN 37.5-325 MG PO TABS: 1.0000 | ORAL_TABLET | Freq: Four times a day (QID) | ORAL | Status: AC | PRN

## 2014-09-06 MED ORDER — CIPROFLOXACIN HCL 500 MG PO TABS
500.0000 mg | ORAL_TABLET | Freq: Two times a day (BID) | ORAL | Status: DC
Start: 2014-09-06 — End: 2014-09-15

## 2014-09-06 MED ORDER — PHENAZOPYRIDINE HCL 200 MG PO TABS: 200.0000 mg | ORAL_TABLET | Freq: Three times a day (TID) | ORAL | Status: DC | PRN

## 2014-09-06 MED ORDER — CIPROFLOXACIN HCL 500 MG PO TABS: 500.0000 mg | ORAL_TABLET | Freq: Two times a day (BID) | ORAL | Status: DC

## 2014-09-06 MED ORDER — TRAMADOL-ACETAMINOPHEN 37.5-325 MG PO TABS: 1.0000 | ORAL_TABLET | Freq: Four times a day (QID) | ORAL | Status: DC | PRN

## 2014-09-06 MED ORDER — CIPROFLOXACIN HCL 500 MG PO TABS
500.0000 mg | ORAL_TABLET | Freq: Every day | ORAL | Status: DC
  Administered 2014-09-06: 500 mg via ORAL
  Filled 2014-09-06: qty 1

## 2014-09-06 NOTE — Progress Notes (Signed)
Pharmacy - Renal antibiotic adjustment per consult  Assessment: 11 yoM s/p cystourethroscopy, Txurethral resection of bladder adenocarcinoma.  Ancef x 1 pre-op; cipro 500 po bid post op  10/20: SCr 3.1; CrCl 15 ml/min  Plan: Cipro adjusted to 500 mg po q24hr.  Interaction w/ warfarin noted and communicated to MD.  Reuel Boom, PharmD Pager: [phone] 09/06/2014, 8:32 AM

## 2014-09-06 NOTE — Progress Notes (Signed)
Urology Progress Note  1 Day Post-Op TUR recurrent invasive TDD baldder, involving trigone through bladder neck.   Subjective: Urine clear. Morphine Rx for pain created confusion last night.      No acute urologic events overnight. Ambulation:   positive Flatus:    positive Bowel movement  negative  Pain: complete resolution  Objective:  Blood pressure 132/64, pulse 59, temperature 98 F (36.7 C), temperature source Oral, resp. rate 17, height 5\' 10"  (1.778 m), weight 68.3 kg (150 lb 9.2 oz), SpO2 96.00%.  Physical Exam:  General:  No acute distress, awake Extremities: Homans sign is negative, no sign of DVT Genitourinary:   Normal bladder exam Foley:  Clear.     I/O last 3 completed shifts: In: 10628.3 [I.V.:1428.3; Other:9200] Out: 9950 [Urine:9950]  Recent Labs     09/05/14  1020  09/06/14  0445  HGB  9.2*  8.9*    Recent Labs     09/05/14  1020  09/06/14  0445  NA  137  137  K  3.7  4.7  CL   --   105  CO2   --   16*  BUN   --   67*  CREATININE   --   3.11*  CALCIUM   --   8.5  GFRNONAA   --   16*  GFRAA   --   19*     Recent Labs     09/05/14  0945  INR  1.54*     No components found with this basename: ABG,   Assessment/Plan: TCC, invasive, with intermittant bleeding. Post resection for palliation. Pt and sons understand palliation. I think he will need Hospice care soon. Note cke with elevated Cr.   Catheter removed. Pt and sons desire d/c today Discussed impending renal failure with sons in front of patient.  Call if high fever, gross blood with clots.

## 2014-09-06 NOTE — Discharge Instructions (Addendum)
Bladder Cancer Bladder cancer is an abnormal growth of tissue in your bladder. Your bladder is the balloon-like sac in your pelvis. It collects and stores urine that comes from the kidneys through the ureters. The bladder wall is made of layers. If cancer spreads into these layers and through the wall of the bladder, it becomes more difficult to treat.  There are four stages of bladder cancer:  Stage I. Cancer at this stage occurs in the bladder's inner lining but has not invaded the muscular bladder wall.  Stage II. At this stage, cancer has invaded the bladder wall but is still confined to the bladder.  Stage III. By this stage, the cancer cells have spread through the bladder wall to surrounding tissue. They may also have spread to the prostate in men or the uterus or vagina in women.  Stage IV. By this stage, cancer cells may have spread to the lymph nodes and other organs, such as your lungs, bones, or liver. RISK FACTORS Although the cause of bladder cancer is not known, the following risk factors can increase your chances of getting bladder cancer:   Smoking.   Occupational exposures, such as rubber, leather, textile, dyes, chemicals, and paint.  Being white.  Age.   Being male.   Having chronic bladder inflammation.   Having a bladder cancer history.   Having a family history of bladder cancer (heredity).   Having had chemotherapy or radiation therapy to the pelvis.   Being exposed to arsenic.  SYMPTOMS   Blood in the urine.   Pain with urination.   Frequent bladder or urine infections.  Increase in urgency and frequency of urination. DIAGNOSIS  Your health care provider may suspect bladder cancer based on your description of urinary symptoms or based on the finding of blood or infection in the urine (especially if this has recurred several times). Other tests or procedures that may be performed include:   A narrow tube being inserted into your bladder  through your urethra (cystoscopy) in order to view the lining of your bladder for tumors.   A biopsy to sample the tumor to see if cancer is present.  If cancer is present, it will then be staged to determine its severity and extent. It is important to know how deeply into the bladder wall the cancer has grown and whether the cancer has spread to any other parts of your body. Staging may require blood tests or special scans such as a CT scan, MRI, bone scan, or chest X-ray.  TREATMENT  Once your cancer has been diagnosed and staged, you should discuss a treatment plan with your health care provider. Based on the stage of the cancer, one treatment or a combination of treatments may be recommended. The most common forms of treatment are:   Surgery. Procedures that may be done include transurethral resection and cystectomy.  Radiation therapy. This is infrequently used to treat bladder cancer.   Chemotherapy. During this treatment, drugs are used to kill cancer cells.  Immunotherapy. This is usually administered directly into the bladder. HOME CARE INSTRUCTIONS  Take medicines only as directed by your health care provider.   Maintain a healthy diet.   Consider joining a support group. This may help you learn to cope with the stress of having bladder cancer.   Seek advice to help you manage treatment side effects.   Keep all follow-up visits as directed by your health care provider.   Inform your cancer specialist if you are  admitted to the hospital.  [place] IF:  There is blood in your urine.  You have symptoms of a urinary tract infection. These include:  Tiredness.  Shakiness.  Weakness.  Muscle aches.  Abdominal pain.  Frequent and intense urge to urinate (in young women).  Burning feeling in the bladder or urethra during urination (in young women). SEEK IMMEDIATE MEDICAL CARE IF:  You are unable to urinate. Document Released: 11/07/2003 Document  Revised: 03/21/2014 Document Reviewed: 04/27/2013 [facility] Patient Information 2015 [place], [place]. This information is not intended to replace advice given to you by your health care provider. Make sure you discuss any questions you have with your health care provider.  Bladder Cancer Bladder cancer is an abnormal growth of tissue in your bladder. Your bladder is the balloon-like sac in your pelvis. It collects and stores urine that comes from the kidneys through the ureters. The bladder wall is made of layers. If cancer spreads into these layers and through the wall of the bladder, it becomes more difficult to treat.  There are four stages of bladder cancer:  Stage I. Cancer at this stage occurs in the bladder's inner lining but has not invaded the muscular bladder wall.  Stage II. At this stage, cancer has invaded the bladder wall but is still confined to the bladder.  Stage III. By this stage, the cancer cells have spread through the bladder wall to surrounding tissue. They may also have spread to the prostate in men or the uterus or vagina in women.  Stage IV. By this stage, cancer cells may have spread to the lymph nodes and other organs, such as your lungs, bones, or liver. RISK FACTORS Although the cause of bladder cancer is not known, the following risk factors can increase your chances of getting bladder cancer:   Smoking.   Occupational exposures, such as rubber, leather, textile, dyes, chemicals, and paint.  Being white.  Age.   Being male.   Having chronic bladder inflammation.   Having a bladder cancer history.   Having a family history of bladder cancer (heredity).   Having had chemotherapy or radiation therapy to the pelvis.   Being exposed to arsenic.  SYMPTOMS   Blood in the urine.   Pain with urination.   Frequent bladder or urine infections.  Increase in urgency and frequency of urination. DIAGNOSIS  Your health care provider may suspect  bladder cancer based on your description of urinary symptoms or based on the finding of blood or infection in the urine (especially if this has recurred several times). Other tests or procedures that may be performed include:   A narrow tube being inserted into your bladder through your urethra (cystoscopy) in order to view the lining of your bladder for tumors.   A biopsy to sample the tumor to see if cancer is present.  If cancer is present, it will then be staged to determine its severity and extent. It is important to know how deeply into the bladder wall the cancer has grown and whether the cancer has spread to any other parts of your body. Staging may require blood tests or special scans such as a CT scan, MRI, bone scan, or chest X-ray.  TREATMENT  Once your cancer has been diagnosed and staged, you should discuss a treatment plan with your health care provider. Based on the stage of the cancer, one treatment or a combination of treatments may be recommended. The most common forms of treatment are:  Surgery. Procedures that may be done include transurethral resection and cystectomy.  Radiation therapy. This is infrequently used to treat bladder cancer.   Chemotherapy. During this treatment, drugs are used to kill cancer cells.  Immunotherapy. This is usually administered directly into the bladder. HOME CARE INSTRUCTIONS  Take medicines only as directed by your health care provider.   Maintain a healthy diet.   Consider joining a support group. This may help you learn to cope with the stress of having bladder cancer.   Seek advice to help you manage treatment side effects.   Keep all follow-up visits as directed by your health care provider.   Inform your cancer specialist if you are admitted to the hospital.  [place] IF:  There is blood in your urine.  You have symptoms of a urinary tract infection. These  include:  Tiredness.  Shakiness.  Weakness.  Muscle aches.  Abdominal pain.  Frequent and intense urge to urinate (in young women).  Burning feeling in the bladder or urethra during urination (in young women). SEEK IMMEDIATE MEDICAL CARE IF:  You are unable to urinate. Document Released: 11/07/2003 Document Revised: 03/21/2014 Document Reviewed: 04/27/2013 [facility] Patient Information 2015 [place], [place]. This information is not intended to replace advice given to you by your health care provider. Make sure you discuss any questions you have with your health care provider.

## 2014-09-06 NOTE — Discharge Summary (Signed)
Physician Discharge Summary  Patient ID: Terry [name] MRN: [id number] DOB/AGE: September 25, 1921 80 y.o.  Admit date: 09/05/2014 Discharge date: 09/06/2014  Admission Diagnoses: bladder cancer  Discharge Diagnoses:  Active Problems:   Cancer of overlapping sites of bladder   Discharged Condition: good  Hospital Course:  TUR-BT  Consults: None  Significant Diagnostic Studies: No results found.  Treatments: IV hydration  Discharge Exam: Blood pressure 132/64, pulse 59, temperature 98 F (36.7 C), temperature source Oral, resp. rate 17, height 5\' 10"  (1.778 m), weight 68.3 kg (150 lb 9.2 oz), SpO2 96.00%. General appearance: alert and cooperative  Disposition: 01-Home or Self Care  Discharge Instructions   Discharge patient    Complete by:  As directed      Discontinue IV    Complete by:  As directed      Foley catheter - discontinue    Complete by:  As directed             Medication List    STOP taking these medications       amoxicillin-clavulanate 500-125 MG per tablet  Commonly known as:  AUGMENTIN     aspirin 81 MG chewable tablet     HYDROcodone-acetaminophen 10-325 MG per tablet  Commonly known as:  NORCO     lisinopril 40 MG tablet  Commonly known as:  PRINIVIL,ZESTRIL     MYRBETRIQ 50 MG Tb24 tablet  Generic drug:  mirabegron ER     trimethoprim 100 MG tablet  Commonly known as:  TRIMPEX     URIBEL 118 MG Caps      TAKE these medications       allopurinol 300 MG tablet  Commonly known as:  ZYLOPRIM  Take 300 mg by mouth every morning. For gout     ciprofloxacin 500 MG tablet  Commonly known as:  CIPRO  Take 1 tablet (500 mg total) by mouth 2 (two) times daily.     cloNIDine 0.1 MG tablet  Commonly known as:  CATAPRES  Take 0.1-0.2 mg by mouth 2 (two) times daily. 2 in the morning and 2 at night     ICAPS PO  Take 1 capsule by mouth 2 (two) times daily.     oxybutynin 5 MG tablet  Commonly known as:  DITROPAN  Take 5 mg by  mouth every 8 (eight) hours as needed for bladder spasms.     phenazopyridine 200 MG tablet  Commonly known as:  PYRIDIUM  Take 200 mg by mouth 3 (three) times daily as needed for pain.     phenazopyridine 200 MG tablet  Commonly known as:  PYRIDIUM  Take 1 tablet (200 mg total) by mouth 3 (three) times daily as needed for pain.     traMADol-acetaminophen 37.5-325 MG per tablet  Commonly known as:  ULTRACET  Take 1 tablet by mouth every 6 (six) hours as needed.     warfarin 2 MG tablet  Commonly known as:  COUMADIN  Take 2 mg by mouth daily. Half a tablet (1 mg) on Monday and Friday. One tablet on all other days.      ASK your doctor about these medications       hydrochlorothiazide 25 MG tablet  Commonly known as:  HYDRODIURIL  Take 25 mg by mouth daily before breakfast.     losartan 50 MG tablet  Commonly known as:  COZAAR  Take 50 mg by mouth 2 (two) times daily.     REFRESH OP  Apply 1 drop  to eye 3 (three) times daily as needed (dry eyes).           Follow-up Information   Follow up with Shaquisha Wynn I, MD In 2 weeks.   Specialty:  Urology   Contact information:   [place] [address] [phone]       Signed: Carolan Clines I 09/06/2014, 1:18 PM

## 2014-09-08 ENCOUNTER — Encounter: Payer: Self-pay | Admitting: Cardiology

## 2014-09-08 ENCOUNTER — Ambulatory Visit (INDEPENDENT_AMBULATORY_CARE_PROVIDER_SITE_OTHER): Payer: Medicare Other | Admitting: Pharmacist Clinician (PhC)/ Clinical Pharmacy Specialist

## 2014-09-08 ENCOUNTER — Ambulatory Visit (INDEPENDENT_AMBULATORY_CARE_PROVIDER_SITE_OTHER): Payer: Medicare Other | Admitting: Cardiology

## 2014-09-08 VITALS — BP 102/60 | HR 57 | Ht 70.0 in | Wt 147.0 lb

## 2014-09-08 DIAGNOSIS — I10 Essential (primary) hypertension: Secondary | ICD-10-CM

## 2014-09-08 DIAGNOSIS — N184 Chronic kidney disease, stage 4 (severe): Secondary | ICD-10-CM

## 2014-09-08 DIAGNOSIS — I4891 Unspecified atrial fibrillation: Secondary | ICD-10-CM

## 2014-09-08 DIAGNOSIS — R001 Bradycardia, unspecified: Secondary | ICD-10-CM

## 2014-09-08 DIAGNOSIS — Z7901 Long term (current) use of anticoagulants: Secondary | ICD-10-CM

## 2014-09-08 LAB — POCT INR: INR: 2.8

## 2014-09-08 NOTE — Progress Notes (Signed)
[facility]. [address], [address], [place]  [address] Phone: [phone] Fax:  [phone]  Date:  09/08/2014   ID:  Terry [name], DOB [birth year], MRN [id number]  PCP:  Gennette Pac, MD   History of Present Illness: Terry [name] is a 80 y.o. male with chronic atrial fibrillation, chronic anticoagulation, metastatic prostate/bladder cancer here for followup. Had cystoscopy with bladder tumor removal. Kidney function worsened, creatinine 3.2. Will be checked next week by Dr. Gaynelle Arabian.  Previously had gross hematuria.   After he himself stopped his warfarin, his hematuria resolved.   Denies  syncope, denies any shortness of breath, no chest pain.   Wt Readings from Last 3 Encounters:  09/08/14 147 lb (66.679 kg)  09/05/14 150 lb 9.2 oz (68.3 kg)  09/05/14 150 lb 9.2 oz (68.3 kg)     Past Medical History  Diagnosis Date  . Hypertension   . History of BPH     reported by patient  . Chronic anticoagulation   . PAF (paroxysmal atrial fibrillation)   . Coronary artery disease CARDIOLOGIST- DR Marlou Porch LAST VISIT 2 MON AGO-- WILL REQUEST NOTE, STRESS TEST AND ECHO  . History of gout STABLE  . Arthritis   . Nocturia   . Frequency of urination   . Osteoarthrosis, unspecified whether generalized or localized, shoulder region   . History of hepatitis 1965    no residual problems  . Diverticulosis   . Hematuria     "occasional pure blood-lab says some blood always"  . Prostate cancer   . History of bladder cancer 02-10-14    surgery planned  . Heart murmur   . RBBB (right bundle branch block)   . Other specified cardiac dysrhythmias(427.89)   . Asthma     AS CHILD ONLY-from dog and cat allergy  . Urinary incontinence     has condom catheter  . Hepatitis     hepatitis- hx of "from fish in [place]"  . Weight loss     at least 50 lbs in 2 years  . Dry eyes     Past Surgical History  Procedure Laterality Date  . Cataract extraction w/ intraocular  lens  implant, bilateral    . Cardiovascular stress test  09-10-2007--  PER DR Jazzmine Kleiman NOTE    LOW RISK/ NO ISCHEMIA  . Transthoracic echocardiogram  11-08-2008    EF 65-70%/  NORMAL LVSF/ MILD AORTIC STENOSIS/ MOD. LEFT ATRIAL ENLARGEMENT  . Cardioversion  X2   2001  . Cryoablation  01/24/2012    Procedure: CRYO ABLATION PROSTATE;  Surgeon: Ailene Rud, MD;  Location: [facility];  Service: Urology;  Laterality: N/A;  . Transurethral resection of prostate      "FROZE PART OF THE PROSTATE"  . Transurethral resection of bladder tumor  12/24/2012    Procedure: TRANSURETHRAL RESECTION OF BLADDER TUMOR (TURBT);  Surgeon: Ailene Rud, MD;  Location: [facility];  Service: Urology;  Laterality: N/A;  . Tonsillectomy      child  . Transurethral resection of bladder tumor N/A 04/08/2014    Procedure: TRANSURETHRAL RESECTION OF BLADDER TUMOR (TURBT);  Surgeon: Ailene Rud, MD;  Location: [facility];  Service: Urology;  Laterality: N/A;  . Transurethral resection of bladder tumor with gyrus (turbt-gyrus) N/A 09/05/2014    Procedure: CYSTOSCOPY WITH TRANSURETHRAL RESECTION OF BLADDER TUMOR WITH GYRUS (TURBT-GYRUS);  Surgeon: Ailene Rud, MD;  Location: [facility];  Service: Urology;  Laterality: N/A;  Current Outpatient Prescriptions  Medication Sig Dispense Refill  . allopurinol (ZYLOPRIM) 300 MG tablet Take 300 mg by mouth every morning. For gout      . ciprofloxacin (CIPRO) 500 MG tablet Take 1 tablet (500 mg total) by mouth 2 (two) times daily.  10 tablet  0  . cloNIDine (CATAPRES) 0.1 MG tablet Take 0.1-0.2 mg by mouth 2 (two) times daily. 2 in the morning and 2 at night      . hydrochlorothiazide (HYDRODIURIL) 25 MG tablet Take 25 mg by mouth daily before breakfast.       . losartan (COZAAR) 50 MG tablet Take 50 mg by mouth 2 (two) times daily.      . Multiple Vitamins-Minerals (ICAPS PO) Take 1 capsule by mouth 2 (two) times daily.       Marland Kitchen oxybutynin  (DITROPAN) 5 MG tablet Take 5 mg by mouth every 8 (eight) hours as needed for bladder spasms.      . phenazopyridine (PYRIDIUM) 200 MG tablet Take 200 mg by mouth 3 (three) times daily as needed for pain.      . phenazopyridine (PYRIDIUM) 200 MG tablet Take 1 tablet (200 mg total) by mouth 3 (three) times daily as needed for pain.  30 tablet  3  . Polyvinyl Alcohol-Povidone (REFRESH OP) Apply 1 drop to eye 3 (three) times daily as needed (dry eyes).       . traMADol-acetaminophen (ULTRACET) 37.5-325 MG per tablet Take 1 tablet by mouth every 6 (six) hours as needed.  30 tablet  2  . warfarin (COUMADIN) 2 MG tablet Take 2 mg by mouth daily. Half a tablet (1 mg) on Monday and Friday. One tablet on all other days.       No current facility-administered medications for this visit.    Allergies:    No Known Allergies  Social History:  The patient  reports that he quit smoking about 63 years ago. His smoking use included Cigarettes. He smoked 0.00 packs per day. His smokeless tobacco use includes Snuff. He reports that he drinks alcohol. He reports that he does not use illicit drugs.   ROS: Chest pain, no falls, no syncope, no bleeding Please see the history of present illness.       PHYSICAL EXAM: VS:  BP 102/60  Pulse 57  Ht 5\' 10"  (1.778 m)  Wt 147 lb (66.679 kg)  BMI 21.09 kg/m2 Well nourished, well developed, in no acute distress, mildly disheveled HEENT: normal, lengthy eyebrows. Neck: no JVD Cardiac:  Bradycardic, irregular, heart rate currently in the 50; no murmur Lungs:  clear to auscultation bilaterally, no wheezing, rhonchi or rales Abd: soft, nontender, no hepatomegaly Ext: no edema Skin: warm and dry Neuro: no focal abnormalities noted  EKG: 09/08/14-atrial fibrillation heart rate 60, right bundle branch block, old inferior infarct pattern, PVC. Since/tracing, heart rate increased.  Labs: Creatinine ranging from 2.2-3.2 during hospitalization in late   ASSESSMENT AND  PLAN:  1. Persistent atrial fibrillation-bradycardia noted. He's not having any symptoms from his bradycardia. Pacemaker may be warranted if syncope occurs. No AV nodal blocking agents. Stable.   2. Chronic anticoagulation-resumed Coumadin post cystoscopy. No hematuria. Checking INR today. 3. Hypertension-he states that he has worked hard on keeping this under control. With his recent acute rise in creatinine I would hold his losartan and his HCTZ. I am okay with him continuing his clonidine. 4. Bradycardia-  no symptoms. As above.  5. Bladder cancer-cystoscopy   See back in 1-2  months.   Signed, Candee Furbish, MD [facility]  09/08/2014 3:59 PM

## 2014-09-08 NOTE — Patient Instructions (Signed)
Please stop your HCTZ and Losartan.  Continue all other medications as listed.  Follow up in 4 to 6 weeks with Dr Marlou Porch.

## 2014-09-12 ENCOUNTER — Encounter (HOSPITAL_COMMUNITY): Payer: Self-pay | Admitting: Emergency Medicine

## 2014-09-12 ENCOUNTER — Ambulatory Visit (INDEPENDENT_AMBULATORY_CARE_PROVIDER_SITE_OTHER): Payer: Medicare Other | Admitting: *Deleted

## 2014-09-12 ENCOUNTER — Inpatient Hospital Stay (HOSPITAL_COMMUNITY)
Admission: EM | Admit: 2014-09-12 | Discharge: 2014-09-15 | DRG: 687 | Disposition: A | Discharge status: Hospice | Payer: Medicare Other | Attending: Internal Medicine | Admitting: Internal Medicine

## 2014-09-12 DIAGNOSIS — M109 Gout, unspecified: Secondary | ICD-10-CM | POA: Diagnosis present

## 2014-09-12 DIAGNOSIS — Z8546 Personal history of malignant neoplasm of prostate: Secondary | ICD-10-CM

## 2014-09-12 DIAGNOSIS — R531 Weakness: Secondary | ICD-10-CM

## 2014-09-12 DIAGNOSIS — Z79899 Other long term (current) drug therapy: Secondary | ICD-10-CM

## 2014-09-12 DIAGNOSIS — D62 Acute posthemorrhagic anemia: Secondary | ICD-10-CM

## 2014-09-12 DIAGNOSIS — I129 Hypertensive chronic kidney disease with stage 1 through stage 4 chronic kidney disease, or unspecified chronic kidney disease: Secondary | ICD-10-CM | POA: Diagnosis present

## 2014-09-12 DIAGNOSIS — K5900 Constipation, unspecified: Secondary | ICD-10-CM

## 2014-09-12 DIAGNOSIS — I482 Chronic atrial fibrillation, unspecified: Secondary | ICD-10-CM

## 2014-09-12 DIAGNOSIS — E875 Hyperkalemia: Secondary | ICD-10-CM

## 2014-09-12 DIAGNOSIS — I480 Paroxysmal atrial fibrillation: Secondary | ICD-10-CM | POA: Diagnosis present

## 2014-09-12 DIAGNOSIS — M1990 Unspecified osteoarthritis, unspecified site: Secondary | ICD-10-CM | POA: Diagnosis present

## 2014-09-12 DIAGNOSIS — Z87891 Personal history of nicotine dependence: Secondary | ICD-10-CM

## 2014-09-12 DIAGNOSIS — Z9842 Cataract extraction status, left eye: Secondary | ICD-10-CM

## 2014-09-12 DIAGNOSIS — Z7901 Long term (current) use of anticoagulants: Secondary | ICD-10-CM

## 2014-09-12 DIAGNOSIS — R310 Gross hematuria: Secondary | ICD-10-CM | POA: Diagnosis present

## 2014-09-12 DIAGNOSIS — R791 Abnormal coagulation profile: Secondary | ICD-10-CM

## 2014-09-12 DIAGNOSIS — R319 Hematuria, unspecified: Secondary | ICD-10-CM

## 2014-09-12 DIAGNOSIS — R32 Unspecified urinary incontinence: Secondary | ICD-10-CM | POA: Diagnosis present

## 2014-09-12 DIAGNOSIS — I4891 Unspecified atrial fibrillation: Secondary | ICD-10-CM

## 2014-09-12 DIAGNOSIS — C678 Malignant neoplasm of overlapping sites of bladder: Secondary | ICD-10-CM

## 2014-09-12 DIAGNOSIS — N179 Acute kidney failure, unspecified: Secondary | ICD-10-CM

## 2014-09-12 DIAGNOSIS — Z961 Presence of intraocular lens: Secondary | ICD-10-CM | POA: Diagnosis present

## 2014-09-12 DIAGNOSIS — I10 Essential (primary) hypertension: Secondary | ICD-10-CM

## 2014-09-12 DIAGNOSIS — N289 Disorder of kidney and ureter, unspecified: Secondary | ICD-10-CM

## 2014-09-12 DIAGNOSIS — Z515 Encounter for palliative care: Secondary | ICD-10-CM

## 2014-09-12 DIAGNOSIS — I2510 Atherosclerotic heart disease of native coronary artery without angina pectoris: Secondary | ICD-10-CM | POA: Diagnosis present

## 2014-09-12 DIAGNOSIS — C679 Malignant neoplasm of bladder, unspecified: Secondary | ICD-10-CM | POA: Diagnosis present

## 2014-09-12 DIAGNOSIS — N184 Chronic kidney disease, stage 4 (severe): Secondary | ICD-10-CM | POA: Diagnosis present

## 2014-09-12 DIAGNOSIS — I4510 Unspecified right bundle-branch block: Secondary | ICD-10-CM | POA: Diagnosis present

## 2014-09-12 DIAGNOSIS — N390 Urinary tract infection, site not specified: Secondary | ICD-10-CM

## 2014-09-12 DIAGNOSIS — Z9841 Cataract extraction status, right eye: Secondary | ICD-10-CM

## 2014-09-12 DIAGNOSIS — G893 Neoplasm related pain (acute) (chronic): Secondary | ICD-10-CM

## 2014-09-12 LAB — URINALYSIS, ROUTINE W REFLEX MICROSCOPIC
Glucose, UA: NEGATIVE mg/dL
KETONES UR: 15 mg/dL — AB
NITRITE: POSITIVE — AB
PH: 5 (ref 5.0–8.0)
Protein, ur: 100 mg/dL — AB
Specific Gravity, Urine: 1.019 (ref 1.005–1.030)
Urobilinogen, UA: 1 mg/dL (ref 0.0–1.0)

## 2014-09-12 LAB — PROTIME-INR
INR: 4.33 — ABNORMAL HIGH (ref 0.00–1.49)
Prothrombin Time: 41.8 seconds — ABNORMAL HIGH (ref 11.6–15.2)

## 2014-09-12 LAB — COMPREHENSIVE METABOLIC PANEL
ALBUMIN: 2.7 g/dL — AB (ref 3.5–5.2)
ALT: <5 U/L (ref 0–53)
ANION GAP: 13 (ref 5–15)
AST: 13 U/L (ref 0–37)
Alkaline Phosphatase: 94 U/L (ref 39–117)
BUN: 77 mg/dL — AB (ref 6–23)
CALCIUM: 8.9 mg/dL (ref 8.4–10.5)
CO2: 17 mEq/L — ABNORMAL LOW (ref 19–32)
Chloride: 114 mEq/L — ABNORMAL HIGH (ref 96–112)
Creatinine, Ser: 3.51 mg/dL — ABNORMAL HIGH (ref 0.50–1.35)
GFR calc Af Amer: 16 mL/min — ABNORMAL LOW (ref >90)
GFR calc non Af Amer: 14 mL/min — ABNORMAL LOW (ref >90)
Glucose, Bld: 118 mg/dL — ABNORMAL HIGH (ref 70–99)
Potassium: 5.4 mEq/L — ABNORMAL HIGH (ref 3.7–5.3)
Sodium: 144 mEq/L (ref 137–147)
TOTAL PROTEIN: 6.5 g/dL (ref 6.0–8.3)
Total Bilirubin: 0.5 mg/dL (ref 0.3–1.2)

## 2014-09-12 LAB — CBC WITH DIFFERENTIAL/PLATELET
Basophils Absolute: 0 10*3/uL (ref 0.0–0.1)
Basophils Relative: 0 % (ref 0–1)
EOS ABS: 0.1 10*3/uL (ref 0.0–0.7)
Eosinophils Relative: 1 % (ref 0–5)
HCT: 28.7 % — ABNORMAL LOW (ref 39.0–52.0)
HEMOGLOBIN: 9.5 g/dL — AB (ref 13.0–17.0)
Lymphocytes Relative: 9 % — ABNORMAL LOW (ref 12–46)
Lymphs Abs: 0.7 10*3/uL (ref 0.7–4.0)
MCH: 29.6 pg (ref 26.0–34.0)
MCHC: 33.1 g/dL (ref 30.0–36.0)
MCV: 89.4 fL (ref 78.0–100.0)
MONO ABS: 0.7 10*3/uL (ref 0.1–1.0)
MONOS PCT: 9 % (ref 3–12)
NEUTROS PCT: 81 % — AB (ref 43–77)
Neutro Abs: 6.4 10*3/uL (ref 1.7–7.7)
Platelets: 265 10*3/uL (ref 150–400)
RBC: 3.21 MIL/uL — ABNORMAL LOW (ref 4.22–5.81)
RDW: 13.3 % (ref 11.5–15.5)
WBC: 7.9 10*3/uL (ref 4.0–10.5)

## 2014-09-12 LAB — URINE MICROSCOPIC-ADD ON

## 2014-09-12 LAB — POCT INR: INR: 8

## 2014-09-12 LAB — SAMPLE TO BLOOD BANK

## 2014-09-12 LAB — POC OCCULT BLOOD, ED: Fecal Occult Bld: NEGATIVE

## 2014-09-12 MED ORDER — DEXTROSE 5 % IV SOLN
1.0000 g | INTRAVENOUS | Status: DC
  Administered 2014-09-13 – 2014-09-14 (×2): 1 g via INTRAVENOUS
  Filled 2014-09-12 (×3): qty 10

## 2014-09-12 MED ORDER — CETYLPYRIDINIUM CHLORIDE 0.05 % MT LIQD
7.0000 mL | Freq: Two times a day (BID) | OROMUCOSAL | Status: DC
  Administered 2014-09-13 – 2014-09-14 (×3): 7 mL via OROMUCOSAL

## 2014-09-12 MED ORDER — CLONIDINE HCL 0.1 MG PO TABS
0.2000 mg | ORAL_TABLET | Freq: Two times a day (BID) | ORAL | Status: DC
  Administered 2014-09-12 – 2014-09-15 (×6): 0.2 mg via ORAL
  Filled 2014-09-12 (×6): qty 2

## 2014-09-12 MED ORDER — ONDANSETRON HCL 4 MG/2ML IJ SOLN: 4.0000 mg | Freq: Four times a day (QID) | INTRAMUSCULAR | Status: DC | PRN

## 2014-09-12 MED ORDER — SODIUM CHLORIDE 0.9 % IV SOLN
Freq: Once | INTRAVENOUS | Status: DC
Start: 2014-09-12 — End: 2014-09-12
  Administered 2014-09-12: 20:00:00 via INTRAVENOUS

## 2014-09-12 MED ORDER — GUAIFENESIN 100 MG/5ML PO SOLN: 5.0000 mL | ORAL | Status: DC | PRN

## 2014-09-12 MED ORDER — SODIUM CHLORIDE 0.9 % IV SOLN
INTRAVENOUS | Status: DC
  Administered 2014-09-13 – 2014-09-15 (×3): via INTRAVENOUS

## 2014-09-12 MED ORDER — TRAMADOL-ACETAMINOPHEN 37.5-325 MG PO TABS: 1.0000 | ORAL_TABLET | Freq: Four times a day (QID) | ORAL | Status: DC | PRN

## 2014-09-12 MED ORDER — PHYTONADIONE 5 MG PO TABS
10.0000 mg | ORAL_TABLET | Freq: Once | ORAL | Status: AC
  Administered 2014-09-12: 10 mg via ORAL
  Filled 2014-09-12: qty 2

## 2014-09-12 MED ORDER — PHENAZOPYRIDINE HCL 200 MG PO TABS
200.0000 mg | ORAL_TABLET | Freq: Three times a day (TID) | ORAL | Status: DC | PRN
  Filled 2014-09-12: qty 1

## 2014-09-12 MED ORDER — ACETAMINOPHEN 650 MG RE SUPP: 650.0000 mg | Freq: Four times a day (QID) | RECTAL | Status: DC | PRN

## 2014-09-12 MED ORDER — SODIUM CHLORIDE 0.9 % IV BOLUS (SEPSIS)
1000.0000 mL | Freq: Once | INTRAVENOUS | Status: AC
  Administered 2014-09-12: 1000 mL via INTRAVENOUS

## 2014-09-12 MED ORDER — DEXTROSE 5 % IV SOLN
1.0000 g | Freq: Once | INTRAVENOUS | Status: AC
  Administered 2014-09-12: 1 g via INTRAVENOUS
  Filled 2014-09-12: qty 10

## 2014-09-12 MED ORDER — ALLOPURINOL 300 MG PO TABS
300.0000 mg | ORAL_TABLET | Freq: Every day | ORAL | Status: DC
  Administered 2014-09-13 – 2014-09-15 (×3): 300 mg via ORAL
  Filled 2014-09-12 (×3): qty 1

## 2014-09-12 MED ORDER — ONDANSETRON HCL 4 MG PO TABS: 4.0000 mg | ORAL_TABLET | Freq: Four times a day (QID) | ORAL | Status: DC | PRN

## 2014-09-12 MED ORDER — ACETAMINOPHEN 325 MG PO TABS: 650.0000 mg | ORAL_TABLET | Freq: Four times a day (QID) | ORAL | Status: DC | PRN

## 2014-09-12 MED ORDER — CHLORHEXIDINE GLUCONATE 0.12 % MT SOLN
15.0000 mL | Freq: Two times a day (BID) | OROMUCOSAL | Status: DC
  Administered 2014-09-12 – 2014-09-15 (×6): 15 mL via OROMUCOSAL
  Filled 2014-09-12 (×6): qty 15

## 2014-09-12 NOTE — ED Notes (Signed)
Pt had blood work done today to check INR, pt's INR was 8.0. Pt also having hematuria s/p urinary procedure performed last week. Pt states he has generalized body aches d/t cold.

## 2014-09-12 NOTE — ED Provider Notes (Signed)
Patient presented to the ER with hematuria after bladder cancer surgery. Patient reports generalized weakness and has not been eating or drinking.  Face to face Exam: HEENT - PERRLA Lungs - CTAB Heart - RRR, no M/R/G Abd - S/NT/ND Neuro - alert, oriented x3  Plan: Patient with hematuria secondary to supratherapeutic INR and recent bladder procedure. Patient has stable vital signs and does not have significant anemia. INR was reportedly ate earlier in the week, 4.33 here today. Urinalysis is suggestive of infection despite being on Cipro. Patient will likely require hospitalization for further management of urinary tract infection and gentle reversal (vitamin K) of INR.   Orpah Greek, MD 09/12/14 [phone]

## 2014-09-12 NOTE — ED Notes (Signed)
Pt had bladder tumors removed on the 19th.  Has had hematuria since.  Sent here for INR of 8.  States that he is on coumadin.  Ordered to stop.

## 2014-09-12 NOTE — ED Provider Notes (Signed)
CSN: [id number]     Arrival date & time 09/12/14  1546 History   First MD Initiated Contact with Patient 09/12/14 1720     Chief Complaint  Patient presents with  . Hematuria     (Consider location/radiation/quality/duration/timing/severity/associated sxs/prior Treatment) HPI Terry [name] is a 80 y.o. male with history of stage IV bladder cancer, presents to emergency department with hematuria and INR of 8 checked in the office today. Patient is on Coumadin for A. fib. Patient had a procedure one week ago, to remove several tumors from his bladder. Prior to that Coumadin was stopped for 1 week and restarted the night of the surgery. Patient states he has had pinkish urine since then. States at times it would be brighter red, but this is intermittent. He reports at baseline urinary incontinence, and wears a depends or condom catheter. He denies any difficulty urinating. He denies any abdominal pain. He denies any active bleeding from his bladder or rectum at this time. He does report generalized weakness, loss of appetite, worsened since his procedure.   Past Medical History  Diagnosis Date  . Hypertension   . History of BPH     reported by patient  . Chronic anticoagulation   . PAF (paroxysmal atrial fibrillation)   . Coronary artery disease CARDIOLOGIST- DR Marlou Porch LAST VISIT 2 MON AGO-- WILL REQUEST NOTE, STRESS TEST AND ECHO  . History of gout STABLE  . Arthritis   . Nocturia   . Frequency of urination   . Osteoarthrosis, unspecified whether generalized or localized, shoulder region   . History of hepatitis 1965    no residual problems  . Diverticulosis   . Hematuria     "occasional pure blood-lab says some blood always"  . Prostate cancer   . History of bladder cancer 02-10-14    surgery planned  . Heart murmur   . RBBB (right bundle branch block)   . Other specified cardiac dysrhythmias(427.89)   . Asthma     AS CHILD ONLY-from dog and cat allergy  . Urinary  incontinence     has condom catheter  . Hepatitis     hepatitis- hx of "from fish in [place]"  . Weight loss     at least 50 lbs in 2 years  . Dry eyes    Past Surgical History  Procedure Laterality Date  . Cataract extraction w/ intraocular lens  implant, bilateral    . Cardiovascular stress test  09-10-2007--  PER DR SKAINS NOTE    LOW RISK/ NO ISCHEMIA  . Transthoracic echocardiogram  11-08-2008    EF 65-70%/  NORMAL LVSF/ MILD AORTIC STENOSIS/ MOD. LEFT ATRIAL ENLARGEMENT  . Cardioversion  X2   2001  . Cryoablation  01/24/2012    Procedure: CRYO ABLATION PROSTATE;  Surgeon: Ailene Rud, MD;  Location: [facility];  Service: Urology;  Laterality: N/A;  . Transurethral resection of prostate      "FROZE PART OF THE PROSTATE"  . Transurethral resection of bladder tumor  12/24/2012    Procedure: TRANSURETHRAL RESECTION OF BLADDER TUMOR (TURBT);  Surgeon: Ailene Rud, MD;  Location: [facility];  Service: Urology;  Laterality: N/A;  . Tonsillectomy      child  . Transurethral resection of bladder tumor N/A 04/08/2014    Procedure: TRANSURETHRAL RESECTION OF BLADDER TUMOR (TURBT);  Surgeon: Ailene Rud, MD;  Location: [facility];  Service: Urology;  Laterality: N/A;  . Transurethral resection of bladder tumor with gyrus (turbt-gyrus)  N/A 09/05/2014    Procedure: CYSTOSCOPY WITH TRANSURETHRAL RESECTION OF BLADDER TUMOR WITH GYRUS (TURBT-GYRUS);  Surgeon: Ailene Rud, MD;  Location: [facility];  Service: Urology;  Laterality: N/A;   Family History  Problem Relation Age of Onset  . Heart failure Father     deceased age 77  . Stroke Mother     deceased   History  Substance Use Topics  . Smoking status: Former Smoker    Types: Cigarettes    Quit date: 11/18/1950  . Smokeless tobacco: Current User    Types: Snuff     Comment: USES 1 CAN OF SNUFF PER MONTH  . Alcohol Use: Yes     Comment: 4-6 beers a day    Review of Systems  Constitutional:  Positive for appetite change and fatigue. Negative for fever and chills.  Respiratory: Negative for cough, chest tightness and shortness of breath.   Cardiovascular: Negative for chest pain, palpitations and leg swelling.  Gastrointestinal: Negative for nausea, vomiting, abdominal pain, diarrhea and abdominal distention.  Genitourinary: Positive for hematuria. Negative for dysuria, urgency, frequency and difficulty urinating.  Musculoskeletal: Negative for myalgias.  Skin: Negative for rash.  Allergic/Immunologic: Negative for immunocompromised state.  Neurological: Positive for weakness. Negative for dizziness, light-headedness, numbness and headaches.      Allergies  Review of patient's allergies indicates no known allergies.  Home Medications   Prior to Admission medications   Medication Sig Start Date End Date Taking? Authorizing Provider  allopurinol (ZYLOPRIM) 300 MG tablet Take 300 mg by mouth every morning. For gout   Yes Historical Provider, MD  cloNIDine (CATAPRES) 0.1 MG tablet Take 0.1-0.2 mg by mouth 2 (two) times daily. 2 in the morning and 2 at night   Yes Historical Provider, MD  Multiple Vitamins-Minerals (ICAPS PO) Take 1 capsule by mouth 2 (two) times daily.    Yes Historical Provider, MD  oxybutynin (DITROPAN) 5 MG tablet Take 5 mg by mouth every 8 (eight) hours as needed for bladder spasms.   Yes Historical Provider, MD  phenazopyridine (PYRIDIUM) 200 MG tablet Take 1 tablet (200 mg total) by mouth 3 (three) times daily as needed for pain. 09/06/14  Yes Debbrah Alar, PA-C  Polyvinyl Alcohol-Povidone (REFRESH OP) Apply 1 drop to eye 3 (three) times daily as needed (dry eyes).    Yes Historical Provider, MD  traMADol-acetaminophen (ULTRACET) 37.5-325 MG per tablet Take 1 tablet by mouth every 6 (six) hours as needed. 09/06/14  Yes Debbrah Alar, PA-C  warfarin (COUMADIN) 2 MG tablet Take 2 mg by mouth daily. Half a tablet (1 mg) on Monday and Friday. One tablet on all  other days.   Yes Historical Provider, MD  ciprofloxacin (CIPRO) 500 MG tablet Take 1 tablet (500 mg total) by mouth 2 (two) times daily. 09/06/14   Amanda Dancy, PA-C   BP 148/48  Pulse 81  Temp(Src) 97 F (36.1 C) (Oral)  Resp 14  SpO2 97% Physical Exam  Nursing note and vitals reviewed. Constitutional: He appears well-developed and well-nourished. No distress.  HENT:  Head: Normocephalic and atraumatic.  Eyes: Conjunctivae are normal.  Neck: Neck supple.  Cardiovascular: Normal rate, regular rhythm and normal heart sounds.   Pulmonary/Chest: Effort normal. No respiratory distress. He has no wheezes. He has no rales.  Abdominal: Soft. Bowel sounds are normal. He exhibits no distension. There is no tenderness. There is no rebound and no guarding.  Genitourinary: Penis normal.  Urine on dependence is yellow.  Musculoskeletal: He exhibits  no edema.  Neurological: He is alert.  Skin: Skin is warm and dry.    ED Course  Procedures (including critical care time) Labs Review Labs Reviewed  URINALYSIS, ROUTINE W REFLEX MICROSCOPIC - Abnormal; Notable for the following:    Color, Urine RED (*)    APPearance TURBID (*)    Hgb urine dipstick LARGE (*)    Bilirubin Urine MODERATE (*)    Ketones, ur 15 (*)    Protein, ur 100 (*)    Nitrite POSITIVE (*)    Leukocytes, UA MODERATE (*)    All other components within normal limits  CBC WITH DIFFERENTIAL - Abnormal; Notable for the following:    RBC 3.21 (*)    Hemoglobin 9.5 (*)    HCT 28.7 (*)    Neutrophils Relative % 81 (*)    Lymphocytes Relative 9 (*)    All other components within normal limits  COMPREHENSIVE METABOLIC PANEL - Abnormal; Notable for the following:    Potassium 5.4 (*)    Chloride 114 (*)    CO2 17 (*)    Glucose, Bld 118 (*)    BUN 77 (*)    Creatinine, Ser 3.51 (*)    Albumin 2.7 (*)    GFR calc non Af Amer 14 (*)    GFR calc Af Amer 16 (*)    All other components within normal limits  PROTIME-INR -  Abnormal; Notable for the following:    Prothrombin Time 41.8 (*)    INR 4.33 (*)    All other components within normal limits  URINE MICROSCOPIC-ADD ON - Abnormal; Notable for the following:    Bacteria, UA MANY (*)    All other components within normal limits  URINE CULTURE  URINE CULTURE  PROTIME-INR  BASIC METABOLIC PANEL  CBC  POC OCCULT BLOOD, ED  POC OCCULT BLOOD, ED  SAMPLE TO BLOOD BANK    Imaging Review No results found.   EKG Interpretation   Date/Time:  Monday September 12 2014 18:36:59 EDT Ventricular Rate:  52 PR Interval:    QRS Duration: 145 QT Interval:  528 QTC Calculation: 491 R Axis:   -17 Text Interpretation:  Atrial fibrillation Ventricular premature complex  Right bundle branch block Baseline wander in lead(s) V6 No significant  change since last tracing Confirmed by POLLINA  MD, [place] [phone]) on  09/12/2014 6:45:30 PM      MDM   Final diagnoses:  Hematuria  Weakness  Supratherapeutic INR  Renal insufficiency     patient here with hematuria post procedure one week ago, on Coumadin, INR and office is 8. Patient's vital signs are normal at this time, no active bleeding on exam. Will check labs including blood counts, repeat INR, peripheral IV ordered, will monitor.  INR here 4. Vitamin K 10mg  PO given. Given worsening renal function, UTI, elevated a and R, Will admit. Patient is also risk for falling given weakness and infection. Discussed with triad will admit. UTI treated with Rocephin.  Spoke with triad will admit  Spoke with urology, will see in AM  Filed Vitals:   09/12/14 1625 09/12/14 1728 09/12/14 2022  BP: 123/54 148/48 149/57  Pulse: 60 81 59  Temp: 97 F (36.1 C)  97.5 F (36.4 C)  TempSrc: Oral  Oral  Resp: 18 14 18   Height:   5\' 10"  (1.778 m)  Weight:   134 lb (60.782 kg)  SpO2: 100% 97% 99%      Kaziyah Parkison A Kazuo Durnil, PA-C 09/13/14  0120 

## 2014-09-12 NOTE — H&P (Signed)
[facility] History and Physical  CAEDMON LOUQUE VFI:[id number] DOB: 01/19/1921 DOA: 09/12/2014  Referring physician: er PCP: Gennette Pac, MD   Chief Complaint: weakness  HPI: Terry [name] is a 80 y.o. male with a history of bladder cancer.  He recently has TUR-P by Dr. Gaynelle Arabian.  He was d/c'd the 20th.  He has been weak and had hematuria since discharge.  Patient had an INR check today and it was found to be 8.  He is on coumadin and was d/c'd on cipro it appears.  Patient also c/o generalized body aches.  He is on coumadin for a fib- very active at home, no history of falls. He reports at baseline urinary incontinence, and wears a depends or condom catheter. +weakness, no fevers  In the Er, his INR was 4.33.  His U/A was suggestive of a UTI despite being on cipro. He was given 10 of vitamin K.    Hospitalist were called for admission   Review of Systems:  All systems reviewed, negative unless stated above   Past Medical History  Diagnosis Date  . Hypertension   . History of BPH     reported by patient  . Chronic anticoagulation   . PAF (paroxysmal atrial fibrillation)   . Coronary artery disease CARDIOLOGIST- DR Marlou Porch LAST VISIT 2 MON AGO-- WILL REQUEST NOTE, STRESS TEST AND ECHO  . History of gout STABLE  . Arthritis   . Nocturia   . Frequency of urination   . Osteoarthrosis, unspecified whether generalized or localized, shoulder region   . History of hepatitis 1965    no residual problems  . Diverticulosis   . Hematuria     "occasional pure blood-lab says some blood always"  . Prostate cancer   . History of bladder cancer 02-10-14    surgery planned  . Heart murmur   . RBBB (right bundle branch block)   . Other specified cardiac dysrhythmias(427.89)   . Asthma     AS CHILD ONLY-from dog and cat allergy  . Urinary incontinence     has condom catheter  . Hepatitis     hepatitis- hx of "from fish in [place]"  . Weight loss     at least 50  lbs in 2 years  . Dry eyes    Past Surgical History  Procedure Laterality Date  . Cataract extraction w/ intraocular lens  implant, bilateral    . Cardiovascular stress test  09-10-2007--  PER DR SKAINS NOTE    LOW RISK/ NO ISCHEMIA  . Transthoracic echocardiogram  11-08-2008    EF 65-70%/  NORMAL LVSF/ MILD AORTIC STENOSIS/ MOD. LEFT ATRIAL ENLARGEMENT  . Cardioversion  X2   2001  . Cryoablation  01/24/2012    Procedure: CRYO ABLATION PROSTATE;  Surgeon: Ailene Rud, MD;  Location: [facility];  Service: Urology;  Laterality: N/A;  . Transurethral resection of prostate      "FROZE PART OF THE PROSTATE"  . Transurethral resection of bladder tumor  12/24/2012    Procedure: TRANSURETHRAL RESECTION OF BLADDER TUMOR (TURBT);  Surgeon: Ailene Rud, MD;  Location: [facility];  Service: Urology;  Laterality: N/A;  . Tonsillectomy      child  . Transurethral resection of bladder tumor N/A 04/08/2014    Procedure: TRANSURETHRAL RESECTION OF BLADDER TUMOR (TURBT);  Surgeon: Ailene Rud, MD;  Location: [facility];  Service: Urology;  Laterality: N/A;  . Transurethral resection of bladder tumor with gyrus (turbt-gyrus) N/A 09/05/2014  Procedure: CYSTOSCOPY WITH TRANSURETHRAL RESECTION OF BLADDER TUMOR WITH GYRUS (TURBT-GYRUS);  Surgeon: Ailene Rud, MD;  Location: [facility];  Service: Urology;  Laterality: N/A;   Social History:  reports that he quit smoking about 63 years ago. His smoking use included Cigarettes. He smoked 0.00 packs per day. His smokeless tobacco use includes Snuff. He reports that he drinks alcohol. He reports that he does not use illicit drugs.  No Known Allergies  Family History  Problem Relation Age of Onset  . Heart failure Father     deceased age 9  . Stroke Mother     deceased     Prior to Admission medications   Medication Sig Start Date End Date Taking? Authorizing Provider  allopurinol (ZYLOPRIM) 300 MG tablet Take 300 mg by  mouth every morning. For gout   Yes Historical Provider, MD  cloNIDine (CATAPRES) 0.1 MG tablet Take 0.1-0.2 mg by mouth 2 (two) times daily. 2 in the morning and 2 at night   Yes Historical Provider, MD  Multiple Vitamins-Minerals (ICAPS PO) Take 1 capsule by mouth 2 (two) times daily.    Yes Historical Provider, MD  oxybutynin (DITROPAN) 5 MG tablet Take 5 mg by mouth every 8 (eight) hours as needed for bladder spasms.   Yes Historical Provider, MD  phenazopyridine (PYRIDIUM) 200 MG tablet Take 1 tablet (200 mg total) by mouth 3 (three) times daily as needed for pain. 09/06/14  Yes Debbrah Alar, PA-C  Polyvinyl Alcohol-Povidone (REFRESH OP) Apply 1 drop to eye 3 (three) times daily as needed (dry eyes).    Yes Historical Provider, MD  traMADol-acetaminophen (ULTRACET) 37.5-325 MG per tablet Take 1 tablet by mouth every 6 (six) hours as needed. 09/06/14  Yes Debbrah Alar, PA-C  warfarin (COUMADIN) 2 MG tablet Take 2 mg by mouth daily. Half a tablet (1 mg) on Monday and Friday. One tablet on all other days.   Yes Historical Provider, MD  ciprofloxacin (CIPRO) 500 MG tablet Take 1 tablet (500 mg total) by mouth 2 (two) times daily. 09/06/14   Debbrah Alar, PA-C   Physical Exam: Filed Vitals:   09/12/14 1625 09/12/14 1728  BP: 123/54 148/48  Pulse: 60 81  Temp: 97 F (36.1 C)   TempSrc: Oral   Resp: 18 14  SpO2: 100% 97%    Wt Readings from Last 3 Encounters:  09/08/14 66.679 kg (147 lb)  09/05/14 68.3 kg (150 lb 9.2 oz)  09/05/14 68.3 kg (150 lb 9.2 oz)    General:  Pale elderly male Eyes: PERRL, normal lids, irises & conjunctiva ENT: mouth appears dry Neck: no LAD, masses or thyromegaly Cardiovascular: irregular, no LE edema Telemetry: SR, no arrhythmias  Respiratory: CTA bilaterally, no w/r/r. Normal respiratory effort. Abdomen: soft, ntnd Skin: senile changes, few bruises Musculoskeletal: grossly normal tone BUE/BLE Psychiatric: grossly normal mood and affect, speech fluent  and appropriate Neurologic: grossly non-focal.          Labs on Admission:  Basic Metabolic Panel:  Recent Labs Lab 09/06/14 0445 09/12/14 1739  NA 137 144  K 4.7 5.4*  CL 105 114*  CO2 16* 17*  GLUCOSE 141* 118*  BUN 67* 77*  CREATININE 3.11* 3.51*  CALCIUM 8.5 8.9   Liver Function Tests:  Recent Labs Lab 09/12/14 1739  AST 13  ALT <5  ALKPHOS 94  BILITOT 0.5  PROT 6.5  ALBUMIN 2.7*   No results found for this basename: LIPASE, AMYLASE,  in the last 168 hours No  results found for this basename: AMMONIA,  in the last 168 hours CBC:  Recent Labs Lab 09/06/14 0445 09/12/14 1739  WBC  --  7.9  NEUTROABS  --  6.4  HGB 8.9* 9.5*  HCT 25.9* 28.7*  MCV  --  89.4  PLT  --  265   Cardiac Enzymes: No results found for this basename: CKTOTAL, CKMB, CKMBINDEX, TROPONINI,  in the last 168 hours  BNP (last 3 results) No results found for this basename: PROBNP,  in the last 8760 hours CBG: No results found for this basename: GLUCAP,  in the last 168 hours  Radiological Exams on Admission: No results found.  EKG- RBBB, fib  Assessment/Plan Active Problems:   HTN (hypertension)   Atrial fibrillation   Bladder cancer   Hematuria   UTI (lower urinary tract infection)   AKI (acute kidney injury)   Hyperkalemia   AKI on CKD- baseline upper 2s, now 3- gentle IVFs  Hematuria- ? If most likely normal after surgery, urology consult called by ER PA, monitor Hgb  Anemia- Hgb appears to be at baseline-suspect will drop with IVF, type and cross, transfuse for < 7  UTI- rocephin, urine culture  H/o bladder cancer- urology consult  Atrial fib- was given large dose of vit K- monitor INRs daily- rate low only on clonidine (Dr. Marlou Porch is his cardiologist)  Weakness- PT/OT eval- due to UTI/hematuria  Hyperkalemia- monitor- runs high in past, IVF    Code Status: full- discussed with patient and son DVT Prophylaxis: Family Communication: son at  bedside Disposition Plan:   Time spent: 59 min  Eulogio Bear [facility] Pager [phone]

## 2014-09-13 DIAGNOSIS — Z7901 Long term (current) use of anticoagulants: Secondary | ICD-10-CM | POA: Diagnosis not present

## 2014-09-13 DIAGNOSIS — Z9841 Cataract extraction status, right eye: Secondary | ICD-10-CM | POA: Diagnosis not present

## 2014-09-13 DIAGNOSIS — I480 Paroxysmal atrial fibrillation: Secondary | ICD-10-CM | POA: Diagnosis present

## 2014-09-13 DIAGNOSIS — N179 Acute kidney failure, unspecified: Secondary | ICD-10-CM | POA: Diagnosis present

## 2014-09-13 DIAGNOSIS — I2510 Atherosclerotic heart disease of native coronary artery without angina pectoris: Secondary | ICD-10-CM | POA: Diagnosis present

## 2014-09-13 DIAGNOSIS — Z87891 Personal history of nicotine dependence: Secondary | ICD-10-CM | POA: Diagnosis not present

## 2014-09-13 DIAGNOSIS — E875 Hyperkalemia: Secondary | ICD-10-CM | POA: Diagnosis present

## 2014-09-13 DIAGNOSIS — N390 Urinary tract infection, site not specified: Secondary | ICD-10-CM | POA: Diagnosis present

## 2014-09-13 DIAGNOSIS — D62 Acute posthemorrhagic anemia: Secondary | ICD-10-CM | POA: Diagnosis present

## 2014-09-13 DIAGNOSIS — I129 Hypertensive chronic kidney disease with stage 1 through stage 4 chronic kidney disease, or unspecified chronic kidney disease: Secondary | ICD-10-CM | POA: Diagnosis present

## 2014-09-13 DIAGNOSIS — Z961 Presence of intraocular lens: Secondary | ICD-10-CM | POA: Diagnosis present

## 2014-09-13 DIAGNOSIS — I4510 Unspecified right bundle-branch block: Secondary | ICD-10-CM | POA: Diagnosis present

## 2014-09-13 DIAGNOSIS — R32 Unspecified urinary incontinence: Secondary | ICD-10-CM | POA: Diagnosis present

## 2014-09-13 DIAGNOSIS — C679 Malignant neoplasm of bladder, unspecified: Secondary | ICD-10-CM | POA: Diagnosis present

## 2014-09-13 DIAGNOSIS — R531 Weakness: Secondary | ICD-10-CM | POA: Diagnosis present

## 2014-09-13 DIAGNOSIS — I10 Essential (primary) hypertension: Secondary | ICD-10-CM

## 2014-09-13 DIAGNOSIS — Z8546 Personal history of malignant neoplasm of prostate: Secondary | ICD-10-CM | POA: Diagnosis not present

## 2014-09-13 DIAGNOSIS — M109 Gout, unspecified: Secondary | ICD-10-CM | POA: Diagnosis present

## 2014-09-13 DIAGNOSIS — N184 Chronic kidney disease, stage 4 (severe): Secondary | ICD-10-CM | POA: Diagnosis present

## 2014-09-13 DIAGNOSIS — C678 Malignant neoplasm of overlapping sites of bladder: Secondary | ICD-10-CM

## 2014-09-13 DIAGNOSIS — Z79899 Other long term (current) drug therapy: Secondary | ICD-10-CM | POA: Diagnosis not present

## 2014-09-13 DIAGNOSIS — Z9842 Cataract extraction status, left eye: Secondary | ICD-10-CM | POA: Diagnosis not present

## 2014-09-13 DIAGNOSIS — Z515 Encounter for palliative care: Secondary | ICD-10-CM | POA: Diagnosis not present

## 2014-09-13 DIAGNOSIS — R310 Gross hematuria: Secondary | ICD-10-CM | POA: Diagnosis present

## 2014-09-13 DIAGNOSIS — R791 Abnormal coagulation profile: Secondary | ICD-10-CM | POA: Diagnosis present

## 2014-09-13 DIAGNOSIS — M1990 Unspecified osteoarthritis, unspecified site: Secondary | ICD-10-CM | POA: Diagnosis present

## 2014-09-13 LAB — CBC
HCT: 26.1 % — ABNORMAL LOW (ref 39.0–52.0)
Hemoglobin: 8.4 g/dL — ABNORMAL LOW (ref 13.0–17.0)
MCH: 29.3 pg (ref 26.0–34.0)
MCHC: 32.2 g/dL (ref 30.0–36.0)
MCV: 90.9 fL (ref 78.0–100.0)
PLATELETS: 160 10*3/uL (ref 150–400)
RBC: 2.87 MIL/uL — ABNORMAL LOW (ref 4.22–5.81)
RDW: 13.6 % (ref 11.5–15.5)
WBC: 8.4 10*3/uL (ref 4.0–10.5)

## 2014-09-13 LAB — BASIC METABOLIC PANEL
Anion gap: 12 (ref 5–15)
BUN: 65 mg/dL — AB (ref 6–23)
CO2: 16 mEq/L — ABNORMAL LOW (ref 19–32)
CREATININE: 2.94 mg/dL — AB (ref 0.50–1.35)
Calcium: 8.3 mg/dL — ABNORMAL LOW (ref 8.4–10.5)
Chloride: 115 mEq/L — ABNORMAL HIGH (ref 96–112)
GFR calc Af Amer: 20 mL/min — ABNORMAL LOW (ref >90)
GFR, EST NON AFRICAN AMERICAN: 17 mL/min — AB (ref >90)
Glucose, Bld: 133 mg/dL — ABNORMAL HIGH (ref 70–99)
POTASSIUM: 4.9 meq/L (ref 3.7–5.3)
Sodium: 143 mEq/L (ref 137–147)

## 2014-09-13 LAB — PROTIME-INR
INR: 4.79 — ABNORMAL HIGH (ref 0.00–1.49)
Prothrombin Time: 45.2 seconds — ABNORMAL HIGH (ref 11.6–15.2)

## 2014-09-13 MED ORDER — SODIUM CHLORIDE 0.9 % IV SOLN: Freq: Once | INTRAVENOUS | Status: DC

## 2014-09-13 NOTE — Care Management Note (Addendum)
    Page 1 of 1   09/14/2014     2:25:05 PM CARE MANAGEMENT NOTE 09/14/2014  Patient:  Terry [name], Terry [name]   Account Number:  [id number]  Date Initiated:  09/13/2014  Documentation initiated by:  Dessa Phi  Subjective/Objective Assessment:   80 Y/O M ADMITTED W/HEMATURIA.     Action/Plan:   FROM HOME.   Anticipated DC Date:  09/15/2014   Anticipated DC Plan:  [place]  CM consult      Choice offered to / List presented to:  C-4 Adult Children           HH agency  [facility] [place]   Status of service:  In process, will continue to follow Medicare Important Message given?   (If response is "NO", the following Medicare IM given date fields will be blank) Date Medicare IM given:   Medicare IM given by:   Date Additional Medicare IM given:   Additional Medicare IM given by:    Discharge Disposition:    Per UR Regulation:  Reviewed for med. necessity/level of care/duration of stay  If discussed at [place] of Stay Meetings, dates discussed:    Comments:  09/14/14 Cathey Fredenburg RN,BSN NCM [address] [place] MET W/PATIENT/SON-GOC/EOL-RECIEVED [place].SPOKE TO PATIENT WHO DEFERRED TO SON HOOKER-TC IN RM ON SPEAKER C#[phone] CHOSEN-TC MARGIE LIASON-LEFT VM W/REFERRAL-DR. SHADDAD TO FOLLOW IN COMMUNITY.DME-NEEDED:3N1,RW.PATIENT ALREADY HAS OVERBED TABLE.SON DOES NOT WANT HOSPITAL BED,W/C AT THIS PRESENT TIME.PATIENT WILL TRANSPORT HOME IN OWN VEHICLE PER SON.PROVIDED MARGIE W/SON'S CONTACT TEL#.AWAIT LIASON'S RESPONSE.  09/13/14 Ninetta Adelstein RN,BSN NCM [phone] PT-HH.NOTED PER UROLOGY-QUESTIONING HOSPICE.

## 2014-09-13 NOTE — Progress Notes (Signed)
[facility] PROGRESS NOTE  Terry [name] PNT:[id number] DOB: 19-Oct-1921 DOA: 09/12/2014 PCP: Gennette Pac, MD  Assessment/Plan: - AKI on CKD - baseline upper 2s - gentle IVFs. Currently 2.9.  - Hematuria- ? If most likely normal after surgery, urology consult called by ER PA, monitor Hgb. Holding anticoagulatnts  - Anemia- Hgb appears to be at baseline-suspect will drop with IVF, type and cross, transfuse for < 7   - UTI- rocephin, urine culture   - H/o bladder cancer- urology consulted. Overall poor prognosis with recs for palliative care consult. Consulted. Will follow recs  - Atrial fib- was given large dose of vit K- monitor INRs daily- rate low only on clonidine (Dr. Marlou Porch is his cardiologist). Holding anticoagulation  - Weakness- PT/OT eval- due to UTI/hematuria   - Hyperkalemia- monitor- runs high in past, IVF  Code Status: Full Family Communication: Pt in room with sons at bedside Disposition Plan: Pending  Consultants:  Urology  Palliative Care  Procedures:    Antibiotics:  Rocephin 10/26  HPI/Subjective: No acute events noted overnight  Objective: Filed Vitals:   09/12/14 1728 09/12/14 2022 09/13/14 0536 09/13/14 1433  BP: 148/48 149/57 140/60 150/56  Pulse: 81 59 60 62  Temp:  97.5 F (36.4 C) 97.4 F (36.3 C) 97.5 F (36.4 C)  TempSrc:  Oral Oral Oral  Resp: 14 18 18 19   Height:  5\' 10"  (1.778 m)    Weight:  60.782 kg (134 lb)    SpO2: 97% 99% 99% 100%    Intake/Output Summary (Last 24 hours) at 09/13/14 1834 Last data filed at 09/13/14 1807  Gross per 24 hour  Intake 688.33 ml  Output   1200 ml  Net -511.67 ml   Filed Weights   09/12/14 2022  Weight: 60.782 kg (134 lb)    Exam:   General:  Awake, in nad   Cardiovascular: regular, s1, s2  Respiratory: normal resp effort, no wheezing  Abdomen: soft,nondistended  Musculoskeletal: perfused, no clubbing   Data Reviewed: Basic Metabolic Panel:  Recent  Labs Lab 09/12/14 1739 09/13/14 0348  NA 144 143  K 5.4* 4.9  CL 114* 115*  CO2 17* 16*  GLUCOSE 118* 133*  BUN 77* 65*  CREATININE 3.51* 2.94*  CALCIUM 8.9 8.3*   Liver Function Tests:  Recent Labs Lab 09/12/14 1739  AST 13  ALT <5  ALKPHOS 94  BILITOT 0.5  PROT 6.5  ALBUMIN 2.7*   No results found for this basename: LIPASE, AMYLASE,  in the last 168 hours No results found for this basename: AMMONIA,  in the last 168 hours CBC:  Recent Labs Lab 09/12/14 1739 09/13/14 0348  WBC 7.9 8.4  NEUTROABS 6.4  --   HGB 9.5* 8.4*  HCT 28.7* 26.1*  MCV 89.4 90.9  PLT 265 160   Cardiac Enzymes: No results found for this basename: CKTOTAL, CKMB, CKMBINDEX, TROPONINI,  in the last 168 hours BNP (last 3 results) No results found for this basename: PROBNP,  in the last 8760 hours CBG: No results found for this basename: GLUCAP,  in the last 168 hours  No results found for this or any previous visit (from the past 240 hour(s)).   Studies: No results found.  Scheduled Meds: . sodium chloride   Intravenous Once  . allopurinol  300 mg Oral Daily  . antiseptic oral rinse  7 mL Mouth Rinse q12n4p  . cefTRIAXone (ROCEPHIN)  IV  1 g Intravenous Q24H  . chlorhexidine  15  mL Mouth Rinse BID  . cloNIDine  0.2 mg Oral BID   Continuous Infusions: . sodium chloride 75 mL/hr at 09/13/14 0342    Active Problems:   HTN (hypertension)   Atrial fibrillation   Bladder cancer   Hematuria   UTI (lower urinary tract infection)   AKI (acute kidney injury)   Hyperkalemia  Time spent: 17min   [facility] Pager [phone]. If 7PM-7AM, please contact night-coverage at [email or web address], password [facility] 09/13/2014, 6:34 PM  LOS: 1 day

## 2014-09-13 NOTE — Evaluation (Signed)
Occupational Therapy Evaluation Patient Details Name: Terry [name] MRN: [id number] DOB: 02-01-1921 Today's Date: 09/13/2014    History of Present Illness Pt is a 80 yo male admitted with hematuria adn UTI with weakness.  Pt with h/o bladder CA and underwent TURP on 10/20.  Pt then got a cold and has been weak since.  INR in ER was 4.5.  Plans to dc home.   Clinical Impression   Pt admitted with the above diagnosis and has the deficits listed below. Pt would benefit from cont OT to increase I and efficiency with all basic adls so he can return home and not have to have 24/ S.  Sons are available at all times to assist.     Follow Up Recommendations  Home health OT;Supervision/Assistance - 24 hour (24/7 just for first few days/ then intermittent)    Equipment Recommendations  None recommended by OT    Recommendations for Other Services       Precautions / Restrictions Precautions Precautions: Fall Precaution Comments: Pt states he has not fallen at home.   Restrictions Weight Bearing Restrictions: No      Mobility Bed Mobility Overal bed mobility: Needs Assistance Bed Mobility: Supine to Sit     Supine to sit: Min guard;HOB elevated     General bed mobility comments: Pt does not have hospital bed at home.  Transfers Overall transfer level: Needs assistance Equipment used: 1 person hand held assist Transfers: Sit to/from Stand Sit to Stand: Min guard         General transfer comment: Pt required cues for hand placement during transfers and cues to wait for assist to stand.    Balance Overall balance assessment: Needs assistance Sitting-balance support: Feet supported Sitting balance-Leahy Scale: Good     Standing balance support: Bilateral upper extremity supported;During functional activity Standing balance-Leahy Scale: Fair Standing balance comment: Pt able to stand w/o support of outside person/walker but felt when taking challenges, his walker would  be helpful.                            ADL Overall ADL's : Needs assistance/impaired Eating/Feeding: Set up;Sitting   Grooming: Oral care;Wash/dry face;Wash/dry hands;Set up;Standing Grooming Details (indicate cue type and reason): S for standing at sink Upper Body Bathing: Set up;Sitting   Lower Body Bathing: Min guard;Sit to/from stand Lower Body Bathing Details (indicate cue type and reason): min guard to transition from sit to stand. Upper Body Dressing : Set up;Sitting   Lower Body Dressing: Min guard;Sit to/from stand   Toilet Transfer: Min guard;Comfort height toilet;Ambulation Toilet Transfer Details (indicate cue type and reason): min guard and cues to reach back for toilet when sitting. Toileting- Water quality scientist and Hygiene: Min guard;Sit to/from stand       Functional mobility during ADLs: Min guard (pushed IV pole) General ADL Comments: Pt did very well with adls.  Pt likes to be independent and does not like to ask for assist and therefore is a fall risk.  Pt did not lose balance during this evaluation but feel he could attempt to get up alone and fall due to catheter and IV line.  Son in room with him.  Pt against chair alarm but son said he would not leave the room w/o nursing being notified.     Vision                     Perception  Praxis      Pertinent Vitals/Pain Pain Assessment: 0-10 Pain Score: 0-No pain     Hand Dominance Right   Extremity/Trunk Assessment Upper Extremity Assessment Upper Extremity Assessment: Overall WFL for tasks assessed   Lower Extremity Assessment Lower Extremity Assessment: Defer to PT evaluation   Cervical / Trunk Assessment Cervical / Trunk Assessment: Normal   Communication Communication Communication: HOH   Cognition Arousal/Alertness: Awake/alert Behavior During Therapy: WFL for tasks assessed/performed Overall Cognitive Status: Within Functional Limits for tasks assessed (Pt  stubborn and does exhibit safety issues; part of personal)                     General Comments       Exercises       Shoulder Instructions      Home Living Family/patient expects to be discharged to:: Private residence Living Arrangements: Children;Other relatives Available Help at Discharge: Family;Available 24 hours/day Type of Home: House Home Access: Stairs to enter [employer] of Steps: 2 Entrance Stairs-Rails: None Home Layout: One level     Bathroom Shower/Tub: Tub/shower unit;Curtain Shower/tub characteristics: Architectural technologist: Standard     Home Equipment: Cane - single point;Other (comment) (condom cathes and urinal/depends for nighttime.)   Additional Comments: Pt lives in house that has several "apartments" in it that his other family members live in.  Son that lives on second story is available most all the time.      Prior Functioning/Environment Level of Independence: Independent with assistive device(s)        Comments: Pt walks with cane, does all cooking, pays bills with son and can bathe but dislikes bathing greatly.    OT Diagnosis: Generalized weakness   OT Problem List: Decreased strength;Decreased knowledge of use of DME or AE   OT Treatment/Interventions: Self-care/ADL training;Therapeutic activities    OT Goals(Current goals can be found in the care plan section) Acute Rehab OT Goals Patient Stated Goal: to go back home and be independent again. OT Goal Formulation: With patient Time For Goal Achievement: 09/20/14 Potential to Achieve Goals: Good ADL Goals Pt Will Perform Grooming: with modified independence;standing Pt Will Perform Lower Body Bathing: with supervision;sit to/from stand Pt Will Perform Lower Body Dressing: with supervision;sit to/from stand Pt Will Perform Tub/Shower Transfer: with supervision;Tub transfer;ambulating Additional ADL Goal #1: Pt will complete all toileting tasks with mod I.  OT  Frequency: Min 2X/week   Barriers to D/C:    Pt states family can be there 24/7 if needed.       Co-evaluation              End of Session Nurse Communication: Mobility status  Activity Tolerance: Patient tolerated treatment well Patient left: in chair;with call bell/phone within reach;with family/visitor present   Time: 5809-9833 OT Time Calculation (min): 44 min Charges:  OT General Charges $OT Visit: 1 Procedure OT Evaluation $Initial OT Evaluation Tier I: 1 Procedure OT Treatments $Self Care/Home Management : 38-52 mins G-Codes: OT G-codes **NOT FOR INPATIENT CLASS** Functional Assessment Tool Used: clinical judgement Functional Limitation: Self care Self Care Current Status (A2505): At least 1 percent but less than 20 percent impaired, limited or restricted Self Care Goal Status (L9767): At least 1 percent but less than 20 percent impaired, limited or restricted  Glenford Peers 09/13/2014, 9:52 AM [phone]

## 2014-09-13 NOTE — Progress Notes (Signed)
UR completed 

## 2014-09-13 NOTE — ED Provider Notes (Signed)
Medical screening examination/treatment/procedure(s) were conducted as a shared visit with non-physician practitioner(s) and myself.  I personally evaluated the patient during the encounter.  Please see separate associated note for evaluation and plan.    EKG Interpretation   Date/Time:  Monday September 12 2014 18:36:59 EDT Ventricular Rate:  52 PR Interval:    QRS Duration: 145 QT Interval:  528 QTC Calculation: 491 R Axis:   -17 Text Interpretation:  Atrial fibrillation Ventricular premature complex  Right bundle branch block Baseline wander in lead(s) V6 No significant  change since last tracing Confirmed by Neeti Knudtson  MD, [place] [phone]) on  09/12/2014 6:45:30 PM       Orpah Greek, MD 09/13/14 1559

## 2014-09-13 NOTE — Plan of Care (Signed)
Problem: Phase I Progression Outcomes Goal: Pain controlled with appropriate interventions Outcome: Completed/Met Date Met:  09/13/14 Pt. Has not complained of any pain today

## 2014-09-13 NOTE — Progress Notes (Signed)
Call received from Dr. Roselee Nova with Urology requesting Hospice Care for Mr. Terry [name]. I have reviewed his chart- will have a palliative provider from our team see him in AM to assist with this transition and to establish goals of care- we need to determine his needs and address his code status as it is currently listed as FULL Code.  Lane Hacker, DO Palliative Medicine [phone]

## 2014-09-13 NOTE — Consult Note (Signed)
Urology Consult  Referring physician:  Dr. Hulan Fess Reason for referral:  Gross hematuria  Chief Complaint:   Gross hematuria, post TUR=-BT  History of Present Illness:   80 yo male with T2 invasive high grade, poorly differentiated urothelial carcinoma, extending from the trigone across the bladder neck and into the prostatic urethra; in addition to incidental prostatic adenocarcinoma. Pt. offered radical robotic cystectomy at the time of diagnosis, 1 1/2 yrs ago, but had 2nd opinion at [facility] with diagnosis of too high risk for surgical or chemotherapy intervention, with life expectancy of only 3 months. Resulting in return to [facility] to be treated expectantly, necessitating recurrent  TUR-BT and clot evacuations. He has successfully recovered from all his surgeries, but understands that his prognosis is poor, and life expectancy is limited.    He has A. Fibrillation, and is treated with coumadin, with INR approx 4.5. However, it has proven difficult to control. He was discharged on trimethoprim antibiotic coverage ( to avoid both sulfa and cipro because of possible coumadin interaction), but may received cipro in ER recently for presumed UTI. Pt now presents with recurrrent gross hematuria without clot formation. He is wearing condom cath.   Past Medical History  Diagnosis Date  . Hypertension   . History of BPH     reported by patient  . Chronic anticoagulation   . PAF (paroxysmal atrial fibrillation)   . Coronary artery disease CARDIOLOGIST- DR Marlou Porch LAST VISIT 2 MON AGO-- WILL REQUEST NOTE, STRESS TEST AND ECHO  . History of gout STABLE  . Arthritis   . Nocturia   . Frequency of urination   . Osteoarthrosis, unspecified whether generalized or localized, shoulder region   . History of hepatitis 1965    no residual problems  . Diverticulosis   . Hematuria     "occasional pure blood-lab says some blood always"  . Prostate cancer   . History of bladder cancer 02-10-14   surgery planned  . Heart murmur   . RBBB (right bundle branch block)   . Other specified cardiac dysrhythmias(427.89)   . Asthma     AS CHILD ONLY-from dog and cat allergy  . Urinary incontinence     has condom catheter  . Hepatitis     hepatitis- hx of "from fish in [place]"  . Weight loss     at least 50 lbs in 2 years  . Dry eyes    Past Surgical History  Procedure Laterality Date  . Cataract extraction w/ intraocular lens  implant, bilateral    . Cardiovascular stress test  09-10-2007--  PER DR SKAINS NOTE    LOW RISK/ NO ISCHEMIA  . Transthoracic echocardiogram  11-08-2008    EF 65-70%/  NORMAL LVSF/ MILD AORTIC STENOSIS/ MOD. LEFT ATRIAL ENLARGEMENT  . Cardioversion  X2   2001  . Cryoablation  01/24/2012    Procedure: CRYO ABLATION PROSTATE;  Surgeon: Ailene Rud, MD;  Location: [facility];  Service: Urology;  Laterality: N/A;  . Transurethral resection of prostate      "FROZE PART OF THE PROSTATE"  . Transurethral resection of bladder tumor  12/24/2012    Procedure: TRANSURETHRAL RESECTION OF BLADDER TUMOR (TURBT);  Surgeon: Ailene Rud, MD;  Location: [facility];  Service: Urology;  Laterality: N/A;  . Tonsillectomy      child  . Transurethral resection of bladder tumor N/A 04/08/2014    Procedure: TRANSURETHRAL RESECTION OF BLADDER TUMOR (TURBT);  Surgeon: Ailene Rud, MD;  Location: [facility];  Service: Urology;  Laterality: N/A;  . Transurethral resection of bladder tumor with gyrus (turbt-gyrus) N/A 09/05/2014    Procedure: CYSTOSCOPY WITH TRANSURETHRAL RESECTION OF BLADDER TUMOR WITH GYRUS (TURBT-GYRUS);  Surgeon: Ailene Rud, MD;  Location: [facility];  Service: Urology;  Laterality: N/A;    Medications: I have reviewed the patient's current medications. Allergies: No Known Allergies  Family History  Problem Relation Age of Onset  . Heart failure Father     deceased age 32  . Stroke Mother     deceased   Social History:   reports that he quit smoking about 63 years ago. His smoking use included Cigarettes. He smoked 0.00 packs per day. His smokeless tobacco use includes Snuff. He reports that he drinks alcohol. He reports that he does not use illicit drugs.  ROS: All systems are reviewed and negative except as noted. Urine bloody. Condom catheter.   Physical Exam:  Vital signs in last 24 hours: Temp:  [97 F (36.1 C)-97.5 F (36.4 C)] 97.4 F (36.3 C) (10/27 0536) Pulse Rate:  [59-81] 60 (10/27 0536) Resp:  [14-18] 18 (10/27 0536) BP: (123-149)/(48-60) 140/60 mmHg (10/27 0536) SpO2:  [97 %-100 %] 99 % (10/27 0536) Weight:  [60.782 kg (134 lb)] 60.782 kg (134 lb) (10/26 2022)  Cardiovascular: Skin warm; not flushed Respiratory: Breaths quiet; no shortness of breath Abdomen: No masses Neurological: Normal sensation to touch Musculoskeletal: Normal motor function arms and legs Lymphatics: No inguinal adenopathy Skin: No rashes Genitourinary: Penis normal. Condom cath. No clots.   Laboratory Data:  Results for orders placed during the hospital encounter of 09/12/14 (from the past 72 hour(s))  URINALYSIS, ROUTINE W REFLEX MICROSCOPIC     Status: Abnormal   Collection Time    09/12/14  4:38 PM      Result Value Ref Range   Color, Urine RED (*) YELLOW   Comment: BIOCHEMICALS MAY BE AFFECTED BY COLOR   APPearance TURBID (*) CLEAR   Specific Gravity, Urine 1.019  1.005 - 1.030   pH 5.0  5.0 - 8.0   Glucose, UA NEGATIVE  NEGATIVE mg/dL   Hgb urine dipstick LARGE (*) NEGATIVE   Bilirubin Urine MODERATE (*) NEGATIVE   Ketones, ur 15 (*) NEGATIVE mg/dL   Protein, ur 100 (*) NEGATIVE mg/dL   Urobilinogen, UA 1.0  0.0 - 1.0 mg/dL   Nitrite POSITIVE (*) NEGATIVE   Leukocytes, UA MODERATE (*) NEGATIVE  URINE MICROSCOPIC-ADD ON     Status: Abnormal   Collection Time    09/12/14  4:38 PM      Result Value Ref Range   WBC, UA TOO NUMEROUS TO COUNT  <3 WBC/hpf   RBC / HPF TOO NUMEROUS TO COUNT  <3 RBC/hpf    Bacteria, UA MANY (*) RARE  CBC WITH DIFFERENTIAL     Status: Abnormal   Collection Time    09/12/14  5:39 PM      Result Value Ref Range   WBC 7.9  4.0 - 10.5 K/uL   RBC 3.21 (*) 4.22 - 5.81 MIL/uL   Hemoglobin 9.5 (*) 13.0 - 17.0 g/dL   HCT 28.7 (*) 39.0 - 52.0 %   MCV 89.4  78.0 - 100.0 fL   MCH 29.6  26.0 - 34.0 pg   MCHC 33.1  30.0 - 36.0 g/dL   RDW 13.3  11.5 - 15.5 %   Platelets 265  150 - 400 K/uL   Neutrophils Relative % 81 (*) 43 -  77 %   Neutro Abs 6.4  1.7 - 7.7 K/uL   Lymphocytes Relative 9 (*) 12 - 46 %   Lymphs Abs 0.7  0.7 - 4.0 K/uL   Monocytes Relative 9  3 - 12 %   Monocytes Absolute 0.7  0.1 - 1.0 K/uL   Eosinophils Relative 1  0 - 5 %   Eosinophils Absolute 0.1  0.0 - 0.7 K/uL   Basophils Relative 0  0 - 1 %   Basophils Absolute 0.0  0.0 - 0.1 K/uL  COMPREHENSIVE METABOLIC PANEL     Status: Abnormal   Collection Time    09/12/14  5:39 PM      Result Value Ref Range   Sodium 144  137 - 147 mEq/L   Potassium 5.4 (*) 3.7 - 5.3 mEq/L   Chloride 114 (*) 96 - 112 mEq/L   CO2 17 (*) 19 - 32 mEq/L   Glucose, Bld 118 (*) 70 - 99 mg/dL   BUN 77 (*) 6 - 23 mg/dL   Creatinine, Ser 3.51 (*) 0.50 - 1.35 mg/dL   Calcium 8.9  8.4 - 10.5 mg/dL   Total Protein 6.5  6.0 - 8.3 g/dL   Albumin 2.7 (*) 3.5 - 5.2 g/dL   AST 13  0 - 37 U/L   ALT <5  0 - 53 U/L   Alkaline Phosphatase 94  39 - 117 U/L   Total Bilirubin 0.5  0.3 - 1.2 mg/dL   GFR calc non Af Amer 14 (*) >90 mL/min   GFR calc Af Amer 16 (*) >90 mL/min   Comment: (NOTE)     The eGFR has been calculated using the CKD EPI equation.     This calculation has not been validated in all clinical situations.     eGFR's persistently <90 mL/min signify possible Chronic Kidney     Disease.   Anion gap 13  5 - 15  PROTIME-INR     Status: Abnormal   Collection Time    09/12/14  5:39 PM      Result Value Ref Range   Prothrombin Time 41.8 (*) 11.6 - 15.2 seconds   INR 4.33 (*) 0.00 - 1.49  SAMPLE TO BLOOD BANK      Status: None   Collection Time    09/12/14  5:39 PM      Result Value Ref Range   Blood Bank Specimen SAMPLE AVAILABLE FOR TESTING     Sample Expiration 09/15/2014    POC OCCULT BLOOD, ED     Status: None   Collection Time    09/12/14  6:05 PM      Result Value Ref Range   Fecal Occult Bld NEGATIVE  NEGATIVE  PROTIME-INR     Status: Abnormal   Collection Time    09/13/14  3:48 AM      Result Value Ref Range   Prothrombin Time 45.2 (*) 11.6 - 15.2 seconds   INR 4.79 (*) 0.00 - 6.96  BASIC METABOLIC PANEL     Status: Abnormal   Collection Time    09/13/14  3:48 AM      Result Value Ref Range   Sodium 143  137 - 147 mEq/L   Potassium 4.9  3.7 - 5.3 mEq/L   Chloride 115 (*) 96 - 112 mEq/L   CO2 16 (*) 19 - 32 mEq/L   Glucose, Bld 133 (*) 70 - 99 mg/dL   BUN 65 (*) 6 - 23 mg/dL  Creatinine, Ser 2.94 (*) 0.50 - 1.35 mg/dL   Calcium 8.3 (*) 8.4 - 10.5 mg/dL   GFR calc non Af Amer 17 (*) >90 mL/min   GFR calc Af Amer 20 (*) >90 mL/min   Comment: (NOTE)     The eGFR has been calculated using the CKD EPI equation.     This calculation has not been validated in all clinical situations.     eGFR's persistently <90 mL/min signify possible Chronic Kidney     Disease.   Anion gap 12  5 - 15  CBC     Status: Abnormal   Collection Time    09/13/14  3:48 AM      Result Value Ref Range   WBC 8.4  4.0 - 10.5 K/uL   RBC 2.87 (*) 4.22 - 5.81 MIL/uL   Hemoglobin 8.4 (*) 13.0 - 17.0 g/dL   HCT 26.1 (*) 39.0 - 52.0 %   MCV 90.9  78.0 - 100.0 fL   MCH 29.3  26.0 - 34.0 pg   MCHC 32.2  30.0 - 36.0 g/dL   RDW 13.6  11.5 - 15.5 %   Platelets 160  150 - 400 K/uL   Comment: DELTA CHECK NOTED     REPEATED TO VERIFY   No results found for this or any previous visit (from the past 240 hour(s)). Creatinine:  Recent Labs  09/12/14 1739 09/13/14 0348  CREATININE 3.51* 2.94*    Xrays:  Impression/Assessment:  1. Hemorrhagic cystitis / UTI: Culture pending. Avoid Sulfa and cipro because  of potential for drug interaction.  2. Recurrent Bladder cancer , p[oorly differentiated, locally extensive, post recent resection. Condom cath. No clots.  3. Acute on chronic kidney disease. Anticipate continued progression of renal failure because of his extensive disease of the trigone. ? Hospice evaluation.  Plan:  Leave condom cath for now.   TANNENBAUM, SIGMUND I 09/13/2014, 8:20 AM

## 2014-09-13 NOTE — Evaluation (Signed)
Physical Therapy Evaluation Patient Details Name: Terry [name] MRN: [id number] DOB: 05/28/1921 Today's Date: 09/13/2014   History of Present Illness  Pt is a 80 yo male admitted with hematuria adn UTI with weakness.  Pt with h/o bladder CA and underwent TURP on 10/20.  Pt then got a cold and has been weak since.  INR in ER was 4.5.  Plans to dc home.  Clinical Impression  On eval, pt required mostly Min guard assist for mobility-able to ambulate ~250 feet with rolling walker. Tolerated distance well. Son present during session-discussed d/c plan-son is able to provide supervision/assist needed.     Follow Up Recommendations Home health PT;Supervision/Assistance - 24 hour    Equipment Recommendations  None recommended by PT    Recommendations for Other Services OT consult     Precautions / Restrictions Precautions Precautions: Fall Precaution Comments: Pt states he has not fallen at home.   Restrictions Weight Bearing Restrictions: No      Mobility  Bed Mobility Overal bed mobility: Needs Assistance Bed Mobility: Sit to Supine     Supine to sit: Min guard;HOB elevated Sit to supine: Min guard;HOB elevated   General bed mobility comments: Pt does not have hospital bed at home.  Transfers Overall transfer level: Needs assistance Equipment used: Rolling walker (2 wheeled) Transfers: Sit to/from Stand Sit to Stand: Min guard         General transfer comment: close guard for safety. VCs hand placement  Ambulation/Gait Ambulation/Gait assistance: Min guard;Min assist Ambulation Distance (Feet): 250 Feet Assistive device: Rolling walker (2 wheeled) Gait Pattern/deviations: Decreased stride length;Trunk flexed;Step-through pattern     General Gait Details: Min assist x1 when pt pushed walker into wheel of hallway chair. Otherwise, pt was Min guard assist. Tolerated distance fairly well.   Stairs            Wheelchair Mobility    Modified Rankin (Stroke  Patients Only)       Balance Overall balance assessment: Needs assistance Sitting-balance support: Feet supported Sitting balance-Leahy Scale: Good     Standing balance support: No upper extremity supported;During functional activity Standing balance-Leahy Scale: Fair Standing balance comment: Pt able to stand w/o support of outside person/walker but felt when taking challenges, his walker would be helpful.                             Pertinent Vitals/Pain Pain Assessment: No/denies pain Pain Score: 0-No pain    Home Living Family/patient expects to be discharged to:: Private residence Living Arrangements: Children;Other relatives Available Help at Discharge: Family;Available 24 hours/day Type of Home: House Home Access: Stairs to enter Entrance Stairs-Rails: None Entrance Stairs-Number of Steps: 2 Home Layout: One level Home Equipment: Cane - single point;Other (comment) (condom cathes and urinal/depends for nighttime.) Additional Comments: Pt lives in house that has several "apartments" in it that his other family members live in.  Son that lives on second story is available most all the time.    Prior Function Level of Independence: Independent with assistive device(s)         Comments: Pt walks with cane, does all cooking, pays bills with son and can bathe but dislikes bathing greatly.     Hand Dominance   Dominant Hand: Right    Extremity/Trunk Assessment   Upper Extremity Assessment: Defer to OT evaluation           Lower Extremity Assessment: Generalized weakness  Cervical / Trunk Assessment: Kyphotic  Communication   Communication: HOH  Cognition Arousal/Alertness: Awake/alert Behavior During Therapy: WFL for tasks assessed/performed Overall Cognitive Status: Within Functional Limits for tasks assessed                      General Comments      Exercises        Assessment/Plan    PT Assessment Patient needs  continued PT services  PT Diagnosis Difficulty walking;Generalized weakness   PT Problem List Decreased strength;Decreased activity tolerance;Decreased balance;Decreased mobility;Decreased knowledge of use of DME  PT Treatment Interventions DME instruction;Gait training;Functional mobility training;Therapeutic activities;Therapeutic exercise;Patient/family education;Balance training   PT Goals (Current goals can be found in the Care Plan section) Acute Rehab PT Goals Patient Stated Goal: to go back home and be independent again. PT Goal Formulation: With patient/family Time For Goal Achievement: 09/27/14 Potential to Achieve Goals: Good    Frequency Min 3X/week   Barriers to discharge        Co-evaluation               End of Session   Activity Tolerance: Patient tolerated treatment well Patient left: in bed;with call bell/phone within reach;with bed alarm set;with family/visitor present           Time: 2010-0712 PT Time Calculation (min): 15 min   Charges:   PT Evaluation $Initial PT Evaluation Tier I: 1 Procedure PT Treatments $Gait Training: 8-22 mins   PT G Codes:          Weston Anna, MPT Pager: [phone]

## 2014-09-13 NOTE — Progress Notes (Signed)
09/13/14 1047  PT Time Calculation  PT Start Time 1026  PT Stop Time 1041  PT Time Calculation (min) 15 min  PT G-Codes **NOT FOR INPATIENT CLASS**  Functional Assessment Tool Used (clinical judgement)  Functional Limitation Mobility: Walking and moving around  Mobility: Walking and Moving Around Current Status (W2574) CI  Mobility: Walking and Moving Around Goal Status (V3552) CI  PT General Charges  $$ ACUTE PT VISIT 1 Procedure  PT Evaluation  $Initial PT Evaluation Tier I 1 Procedure  PT Treatments  $Gait Training 8-22 mins  Weston Anna, MPT [phone]

## 2014-09-14 ENCOUNTER — Telehealth: Payer: Self-pay | Admitting: Medical Oncology

## 2014-09-14 DIAGNOSIS — G893 Neoplasm related pain (acute) (chronic): Secondary | ICD-10-CM

## 2014-09-14 DIAGNOSIS — D62 Acute posthemorrhagic anemia: Secondary | ICD-10-CM

## 2014-09-14 DIAGNOSIS — K5900 Constipation, unspecified: Secondary | ICD-10-CM

## 2014-09-14 DIAGNOSIS — I4891 Unspecified atrial fibrillation: Secondary | ICD-10-CM

## 2014-09-14 LAB — URINE CULTURE: Colony Count: 25000

## 2014-09-14 LAB — BASIC METABOLIC PANEL
Anion gap: 12 (ref 5–15)
BUN: 45 mg/dL — ABNORMAL HIGH (ref 6–23)
CHLORIDE: 118 meq/L — AB (ref 96–112)
CO2: 16 meq/L — AB (ref 19–32)
Calcium: 7.8 mg/dL — ABNORMAL LOW (ref 8.4–10.5)
Creatinine, Ser: 2.09 mg/dL — ABNORMAL HIGH (ref 0.50–1.35)
GFR calc Af Amer: 30 mL/min — ABNORMAL LOW (ref >90)
GFR calc non Af Amer: 26 mL/min — ABNORMAL LOW (ref >90)
Glucose, Bld: 101 mg/dL — ABNORMAL HIGH (ref 70–99)
Potassium: 4.6 mEq/L (ref 3.7–5.3)
SODIUM: 146 meq/L (ref 137–147)

## 2014-09-14 LAB — CBC
HCT: 23.3 % — ABNORMAL LOW (ref 39.0–52.0)
Hemoglobin: 7.6 g/dL — ABNORMAL LOW (ref 13.0–17.0)
MCH: 29.1 pg (ref 26.0–34.0)
MCHC: 32.6 g/dL (ref 30.0–36.0)
MCV: 89.3 fL (ref 78.0–100.0)
PLATELETS: 154 10*3/uL (ref 150–400)
RBC: 2.61 MIL/uL — AB (ref 4.22–5.81)
RDW: 13.6 % (ref 11.5–15.5)
WBC: 8 10*3/uL (ref 4.0–10.5)

## 2014-09-14 MED ORDER — SENNOSIDES-DOCUSATE SODIUM 8.6-50 MG PO TABS: 2.0000 | ORAL_TABLET | Freq: Every day | ORAL | Status: DC

## 2014-09-14 NOTE — Consult Note (Signed)
Patient PO:Terry [name]      DOB: 1921/06/21      TIR:[id number]     Consult Note from the Palliative Medicine Team at [place] Requested by: Dr Terry [name]     PCP: Terry Pac, MD Reason for Consultation:goals of care, ? hospice    Phone Number:[phone]  Assessment/Recommendations: 80 yo male with metastatic Bladder CA who presented with weakness/heamturia. Palliative care consulted for goals of care, EOL issues, possible hospice.   1.  Code Status: DNR. Discussed today and addressed.  CPR would not meet his goal of being able to pass away at home and not prolong functional decline.  He agrees that full DNR fits his wishes. I have placed OOH DNR in chart  2. Goals of Care: Home with hospice care.  He wants focus to be on comfort and hopes to be able to pass away at home. Avoid hospitalizations if possible. He would return home with his son Terry [name] and 2 grandchildren.  They believe they had contact with hospice of [facility] in the past and would be interested in their care at home.  Would not restart anticoag for Afib given his prognosis, hematuria, and goals.     3. Symptom Management:   1. Pain: Post op and related to bladder/urethral surgery. On pyridium and tramadol PRN.  Controlled 2. Constipation: hard BM's. Will add doc/senna 3. Appetite Loss: Not interested in taking medicine for this as not particularly bothersome  4. Psychosocial/Spiritual: lives at home with his son Terry [name].  Previous to hospital stay for TURp, very active in garden and doing jobs around house despite age and disease. Has not bounced back as he normally does.  Well supported by family.       Brief HPI: Patient is a 80 yo male with PMHx of Afib, CAD, CKD, bladder CA s/p TUR-BT with clot evacuation. He presented with hematuria and elevated INR when checked.  Chronically on coumadin for Afib.  He also endorsed weakness on admission.  Recently treated for UTI with cipro and  trimethoprim recently. For his bladder CA, he has had 2nd opinion at [facility] and deemed to high risk for surgical intervention or chemotherapy.  Urology has documented life expectancy of 3 months. Urology has followed and discussed hospice care which patient and family are interested in pursuing. Palliative Medicine consulted for assistance with this and other EOL issues.    In talking to Terry [name] today, he reports that pain is well controlled. Some loss of appetite but not interested in medications for this. Having some hard bowel movements.  Weakness worse since surgery and unable to bounce back like he usually does. Has difficulty getting out of bed by himself.     PMH:  Past Medical History  Diagnosis Date  . Hypertension   . History of BPH     reported by patient  . Chronic anticoagulation   . PAF (paroxysmal atrial fibrillation)   . Coronary artery disease CARDIOLOGIST- DR Terry [name] LAST VISIT 2 MON AGO-- WILL REQUEST NOTE, STRESS TEST AND ECHO  . History of gout STABLE  . Arthritis   . Nocturia   . Frequency of urination   . Osteoarthrosis, unspecified whether generalized or localized, shoulder region   . History of hepatitis 1965    no residual problems  . Diverticulosis   . Hematuria     "occasional pure blood-lab says some blood always"  . Prostate cancer   . History of bladder cancer 02-10-14  surgery planned  . Heart murmur   . RBBB (right bundle branch block)   . Other specified cardiac dysrhythmias(427.89)   . Asthma     AS CHILD ONLY-from dog and cat allergy  . Urinary incontinence     has condom catheter  . Hepatitis     hepatitis- hx of "from fish in [place]"  . Weight loss     at least 50 lbs in 2 years  . Dry eyes      PSH: Past Surgical History  Procedure Laterality Date  . Cataract extraction w/ intraocular lens  implant, bilateral    . Cardiovascular stress test  09-10-2007--  PER DR SKAINS NOTE    LOW RISK/ NO ISCHEMIA  . Transthoracic  echocardiogram  11-08-2008    EF 65-70%/  NORMAL LVSF/ MILD AORTIC STENOSIS/ MOD. LEFT ATRIAL ENLARGEMENT  . Cardioversion  X2   2001  . Cryoablation  01/24/2012    Procedure: CRYO ABLATION PROSTATE;  Surgeon: Terry Rud, MD;  Location: [facility];  Service: Urology;  Laterality: N/A;  . Transurethral resection of prostate      "FROZE PART OF THE PROSTATE"  . Transurethral resection of bladder tumor  12/24/2012    Procedure: TRANSURETHRAL RESECTION OF BLADDER TUMOR (TURBT);  Surgeon: Terry Rud, MD;  Location: [facility];  Service: Urology;  Laterality: N/A;  . Tonsillectomy      child  . Transurethral resection of bladder tumor N/A 04/08/2014    Procedure: TRANSURETHRAL RESECTION OF BLADDER TUMOR (TURBT);  Surgeon: Terry Rud, MD;  Location: [facility];  Service: Urology;  Laterality: N/A;  . Transurethral resection of bladder tumor with gyrus (turbt-gyrus) N/A 09/05/2014    Procedure: CYSTOSCOPY WITH TRANSURETHRAL RESECTION OF BLADDER TUMOR WITH GYRUS (TURBT-GYRUS);  Surgeon: Terry Rud, MD;  Location: [facility];  Service: Urology;  Laterality: N/A;   I have reviewed the [place] [facility] and [facility] and  If appropriate update it with new information. No Known Allergies Scheduled Meds: . sodium chloride   Intravenous Once  . allopurinol  300 mg Oral Daily  . antiseptic oral rinse  7 mL Mouth Rinse q12n4p  . cefTRIAXone (ROCEPHIN)  IV  1 g Intravenous Q24H  . chlorhexidine  15 mL Mouth Rinse BID  . cloNIDine  0.2 mg Oral BID   Continuous Infusions: . sodium chloride 75 mL/hr at 09/13/14 2318   PRN Meds:.acetaminophen, acetaminophen, guaiFENesin, ondansetron (ZOFRAN) IV, ondansetron, phenazopyridine, traMADol-acetaminophen    BP 136/46  Pulse 95  Temp(Src) 98.2 F (36.8 C) (Oral)  Resp 19  Ht 5\' 10"  (1.778 m)  Wt 60.782 kg (134 lb)  BMI 19.23 kg/m2  SpO2 98%   PPS: 50   Intake/Output Summary (Last 24 hours) at 09/14/14 0906 Last data filed at  09/14/14 0818  Gross per 24 hour  Intake   2570 ml  Output   1275 ml  Net   1295 ml    Physical Exam:  General:  Alert, NAD HEENT:  Terry [name], mmm Chest:   Coarse sounds CVS: regular rate Abdomen:soft, ND Ext:  No edema   Labs: CBC    Component Value Date/Time   WBC 8.0 09/14/2014 0344   WBC 6.0 08/24/2014 0851   RBC 2.61* 09/14/2014 0344   RBC 3.34* 08/24/2014 0851   HGB 7.6* 09/14/2014 0344   HGB 10.1* 08/24/2014 0851   HCT 23.3* 09/14/2014 0344   HCT 30.9* 08/24/2014 0851   PLT 154 09/14/2014 0344   PLT 187 08/24/2014  0851   MCV 89.3 09/14/2014 0344   MCV 92.5 08/24/2014 0851   MCH 29.1 09/14/2014 0344   MCH 30.2 08/24/2014 0851   MCHC 32.6 09/14/2014 0344   MCHC 32.7 08/24/2014 0851   RDW 13.6 09/14/2014 0344   RDW 14.3 08/24/2014 0851   LYMPHSABS 0.7 09/12/2014 1739   LYMPHSABS 1.9 08/24/2014 0851   MONOABS 0.7 09/12/2014 1739   MONOABS 0.6 08/24/2014 0851   EOSABS 0.1 09/12/2014 1739   EOSABS 0.1 08/24/2014 0851   BASOSABS 0.0 09/12/2014 1739   BASOSABS 0.0 08/24/2014 0851    BMET    Component Value Date/Time   NA 146 09/14/2014 0344   NA 142 08/24/2014 0851   K 4.6 09/14/2014 0344   K 5.8* 08/24/2014 0851   CL 118* 09/14/2014 0344   CL 114* 05/04/2013 0858   CO2 16* 09/14/2014 0344   CO2 19* 08/24/2014 0851   GLUCOSE 101* 09/14/2014 0344   GLUCOSE 86 08/24/2014 0851   GLUCOSE 121* 05/04/2013 0858   BUN 45* 09/14/2014 0344   BUN 44.8* 08/24/2014 0851   CREATININE 2.09* 09/14/2014 0344   CREATININE 2.2* 08/24/2014 0851   CALCIUM 7.8* 09/14/2014 0344   CALCIUM 9.5 08/24/2014 0851   GFRNONAA 26* 09/14/2014 0344   GFRAA 30* 09/14/2014 0344    CMP     Component Value Date/Time   NA 146 09/14/2014 0344   NA 142 08/24/2014 0851   K 4.6 09/14/2014 0344   K 5.8* 08/24/2014 0851   CL 118* 09/14/2014 0344   CL 114* 05/04/2013 0858   CO2 16* 09/14/2014 0344   CO2 19* 08/24/2014 0851   GLUCOSE 101* 09/14/2014 0344   GLUCOSE 86 08/24/2014 0851   GLUCOSE 121* 05/04/2013 0858    BUN 45* 09/14/2014 0344   BUN 44.8* 08/24/2014 0851   CREATININE 2.09* 09/14/2014 0344   CREATININE 2.2* 08/24/2014 0851   CALCIUM 7.8* 09/14/2014 0344   CALCIUM 9.5 08/24/2014 0851   PROT 6.5 09/12/2014 1739   PROT 5.8* 08/24/2014 0851   ALBUMIN 2.7* 09/12/2014 1739   ALBUMIN 3.6 08/24/2014 0851   AST 13 09/12/2014 1739   AST 17 08/24/2014 0851   ALT <5 09/12/2014 1739   ALT 10 08/24/2014 0851   ALKPHOS 94 09/12/2014 1739   ALKPHOS 115 08/24/2014 0851   BILITOT 0.5 09/12/2014 1739   BILITOT 0.51 08/24/2014 0851   GFRNONAA 26* 09/14/2014 0344   GFRAA 30* 09/14/2014 0344    Total Time: 70 minutes  Greater than 50%  of this time was spent counseling and coordinating care related to the above assessment and plan.   Doran Clay D.O. Palliative Medicine Team at [facility]  Pager: [phone] Team Phone: [phone]

## 2014-09-14 NOTE — Progress Notes (Signed)
[facility] PROGRESS NOTE  AAKASH HOLLOMON NUU:[id number] DOB: Feb 16, 1921 DOA: 09/12/2014 PCP: Gennette Pac, MD  Assessment/Plan: - AKI on CKD - baseline upper 2s - gentle IVFs. Today down to 2.09. Concerns for gradually worsening renal function in light of pt's hx of bladder cancer. Palliative care consulted  - Hematuria- ? If most likely normal after surgery, urology consult called by ER PA, monitor Hgb. Holding anticoagulatnts. Hematuria improving with Urology recs that it is ok to d/c condom cath  - Acute blood loss anemia- Hgb down to 7.6, plans to transfuse for hgb< 7   - UTI- On empiric rocephin, urine culture pending  - H/o bladder cancer- urology consulted. Overall poor prognosis with recs for palliative care consult. Consulted. Will follow recs  - Atrial fib- was given large dose of vit K- monitor INRs daily- rate low only on clonidine (Dr. Marlou Porch is his cardiologist). Holding anticoagulation secondary to above  - Weakness- PT/OT eval- due to UTI/hematuria   - Hyperkalemia- monitor- runs high in past, IVF  Code Status: Full Family Communication: Pt and son in room Disposition Plan: Pending  Consultants:  Urology  Palliative Care  Procedures:    Antibiotics:  Rocephin 10/26>>>  HPI/Subjective: No acute events noted  Objective: Filed Vitals:   09/13/14 0536 09/13/14 1433 09/13/14 2148 09/14/14 0548  BP: 140/60 150/56 159/46 136/46  Pulse: 60 62 50 95  Temp: 97.4 F (36.3 C) 97.5 F (36.4 C) 97.7 F (36.5 C) 98.2 F (36.8 C)  TempSrc: Oral Oral Oral Oral  Resp: 18 19 20 19   Height:      Weight:      SpO2: 99% 100% 100% 98%    Intake/Output Summary (Last 24 hours) at 09/14/14 1015 Last data filed at 09/14/14 0818  Gross per 24 hour  Intake   2570 ml  Output   1275 ml  Net   1295 ml   Filed Weights   09/12/14 2022  Weight: 60.782 kg (134 lb)    Exam:   General:  Awake, in nad   Cardiovascular: regular, s1,  s2  Respiratory: normal resp effort, no wheezing  Abdomen: soft,nondistended  Musculoskeletal: perfused, no clubbing   Data Reviewed: Basic Metabolic Panel:  Recent Labs Lab 09/12/14 1739 09/13/14 0348 09/14/14 0344  NA 144 143 146  K 5.4* 4.9 4.6  CL 114* 115* 118*  CO2 17* 16* 16*  GLUCOSE 118* 133* 101*  BUN 77* 65* 45*  CREATININE 3.51* 2.94* 2.09*  CALCIUM 8.9 8.3* 7.8*   Liver Function Tests:  Recent Labs Lab 09/12/14 1739  AST 13  ALT <5  ALKPHOS 94  BILITOT 0.5  PROT 6.5  ALBUMIN 2.7*   No results found for this basename: LIPASE, AMYLASE,  in the last 168 hours No results found for this basename: AMMONIA,  in the last 168 hours CBC:  Recent Labs Lab 09/12/14 1739 09/13/14 0348 09/14/14 0344  WBC 7.9 8.4 8.0  NEUTROABS 6.4  --   --   HGB 9.5* 8.4* 7.6*  HCT 28.7* 26.1* 23.3*  MCV 89.4 90.9 89.3  PLT 265 160 154   Cardiac Enzymes: No results found for this basename: CKTOTAL, CKMB, CKMBINDEX, TROPONINI,  in the last 168 hours BNP (last 3 results) No results found for this basename: PROBNP,  in the last 8760 hours CBG: No results found for this basename: GLUCAP,  in the last 168 hours  Recent Results (from the past 240 hour(s))  URINE CULTURE  Status: None   Collection Time    09/12/14  7:15 PM      Result Value Ref Range Status   Specimen Description URINE, CLEAN CATCH   Final   Special Requests NONE   Final   Culture  Setup Time     Final   Value: 09/13/2014 00:36     Performed at [facility] Count     Final   Value: 25,000 COLONIES/ML     Performed at [facility]   Culture     Final   Value: Multiple bacterial morphotypes present, none predominant. Suggest appropriate recollection if clinically indicated.     Performed at [facility]   Report Status 09/14/2014 FINAL   Final     Studies: No results found.  Scheduled Meds: . sodium chloride   Intravenous Once  . allopurinol  300 mg Oral  Daily  . antiseptic oral rinse  7 mL Mouth Rinse q12n4p  . cefTRIAXone (ROCEPHIN)  IV  1 g Intravenous Q24H  . chlorhexidine  15 mL Mouth Rinse BID  . cloNIDine  0.2 mg Oral BID  . senna-docusate  2 tablet Oral QHS   Continuous Infusions: . sodium chloride 75 mL/hr at 09/13/14 2318    Active Problems:   HTN (hypertension)   Atrial fibrillation   Bladder cancer   Hematuria   UTI (lower urinary tract infection)   AKI (acute kidney injury)   Hyperkalemia  Time spent: 31min   Jonaven Hilgers, [facility] Pager [phone]. If 7PM-7AM, please contact night-coverage at [email or web address], password [facility] 09/14/2014, 10:15 AM  LOS: 2 days

## 2014-09-14 NOTE — Consult Note (Signed)
Subjective: The patient reports clearing of urine overnight. Appreciate Hospice review of chart. Have begun discussion re: Code status. Son and patient agree that pt does NOT want ET tube, but that chest compresssions would be ok. No feeding tube, and ? Cardiac meds if pt codes. Pt understands that he needs to be as specific as possible, and that the Hospice MD will be by today to see him, and , hopefully will address end of life quality issues with him. Code Blue will need to be changed. Pt has living will, but pt/son is considering changes.  Urine is clear today, and ureine c/s is pending. Anticipate c/s tomorrow.   Objective: Vital signs in last 24 hours: Temp:  [97.5 F (36.4 C)-98.2 F (36.8 C)] 98.2 F (36.8 C) (10/28 0548) Pulse Rate:  [50-95] 95 (10/28 0548) Resp:  [19-20] 19 (10/28 0548) BP: (136-159)/(46-56) 136/46 mmHg (10/28 0548) SpO2:  [98 %-100 %] 98 % (10/28 0548)A  Intake/Output from previous day: 10/27 0701 - 10/28 0700 In: 2330 [P.O.:480; I.V.:1800; IV Piggyback:50] Out: 1275 [Urine:1275] Intake/Output this shift: Total I/O In: 240 [P.O.:240] Out: -   Past Medical History  Diagnosis Date  . Hypertension   . History of BPH     reported by patient  . Chronic anticoagulation   . PAF (paroxysmal atrial fibrillation)   . Coronary artery disease CARDIOLOGIST- DR Marlou Porch LAST VISIT 2 MON AGO-- WILL REQUEST NOTE, STRESS TEST AND ECHO  . History of gout STABLE  . Arthritis   . Nocturia   . Frequency of urination   . Osteoarthrosis, unspecified whether generalized or localized, shoulder region   . History of hepatitis 1965    no residual problems  . Diverticulosis   . Hematuria     "occasional pure blood-lab says some blood always"  . Prostate cancer   . History of bladder cancer 02-10-14    surgery planned  . Heart murmur   . RBBB (right bundle branch block)   . Other specified cardiac dysrhythmias(427.89)   . Asthma     AS CHILD ONLY-from dog and cat  allergy  . Urinary incontinence     has condom catheter  . Hepatitis     hepatitis- hx of "from fish in [place]"  . Weight loss     at least 50 lbs in 2 years  . Dry eyes     Physical Exam:  Lungs - Normal respiratory effort, chest expands symmetrically.  Abdomen - Soft, non-tender & non-distended.  Lab Results: INR: 4.79  Recent Labs  09/12/14 1739 09/13/14 0348 09/14/14 0344  WBC 7.9 8.4 8.0  HGB 9.5* 8.4* 7.6*  HCT 28.7* 26.1* 23.3*   BMET  Recent Labs  09/13/14 0348 09/14/14 0344  NA 143 146  K 4.9 4.6  CL 115* 118*  CO2 16* 16*  GLUCOSE 133* 101*  BUN 65* 45*  CREATININE 2.94* 2.09*  CALCIUM 8.3* 7.8*   No results found for this basename: LABURIN,  in the last 72 hours Results for orders placed during the hospital encounter of 09/12/14  URINE CULTURE     Status: None   Collection Time    09/12/14  7:15 PM      Result Value Ref Range Status   Specimen Description URINE, CLEAN CATCH   Final   Special Requests NONE   Final   Culture  Setup Time     Final   Value: 09/13/2014 00:36     Performed at [facility]  [place] Count     Final   Value: 25,000 COLONIES/ML     Performed at [facility]   Culture     Final   Value: Multiple bacterial morphotypes present, none predominant. Suggest appropriate recollection if clinically indicated.     Performed at [facility]   Report Status 09/14/2014 FINAL   Final    Studies/Results: No results found.  Assessment: Urine clearing today.  ARF resolving. ESED continues ( CKD4) Invasive TCC bladder Prostate cancer Plan: OK for D/c with condom cath from Urology standpoint  Hospice evaluation today.  Cleven Jansma I 09/14/2014, 8:45 AM

## 2014-09-14 NOTE — Telephone Encounter (Signed)
Terry [name] with Hospice and [place], called informing office that patient has decided on hospice care after hospital discharge today. Dewaine Oats asking whether Dr. Alen Blew will be attending. Per MD, V.O given for Dr. Alen Blew to be attending, hospice to do symptom management.

## 2014-09-14 NOTE — Progress Notes (Signed)
Notified by [facility], patient and family request services of Hospcie and Palliative Care of [facility]) after discharge. Spoke with pt  And sons [name] and Ed to initiate education related to hospice services, philosophy and team approach to care; they voiced good understanding of information provided.  Son Ed shared he was not present for discussion with PMT Dr Deitra Mayo and he had questions regarding the recommendation to stop Coumadin at discharge; son [name] voiced he was in agreement with stopping the Coumadin and both sons discussed risks of falling and bleeding and stated that as long as all of Mr [place] doctors were in agreement they are comfortable with this decision and the possibility of minimizing medications once home. Per notes plan is to d/c home via personal vehicle when pt medically ready; family indicates possibly tomorrow. *Pt will go home with condom catheter in place. *Please send signed DNR Form in shadow chart home with pt  DME needs discussed currently pt and son request Rolling Walker with wheels and 3n1 BSC brought to pt's room and they will take items home; [facility] contacted to request these items be brought to the room;  pt declines a hospital bed or other DME at this time; son has [place] equipment list and is aware they can request additional equipment as needed once home and the [facility] team will assist Initial paperwork faxed to [facility]  Completed d/c summary will need to be faxed to [facility] @ [phone] when final Please notify [facility] when patient is ready to leave unit at d/c call [phone] (or [phone] if after 5 pm);  [facility] information and contact numbers also given to son, [name] during visit.   Above information will be shared with [facility] Please call with any questions or concerns   Danton Sewer, RN 09/14/2014, 5:06 PM [facility] of [facility] Liaison [phone]

## 2014-09-15 LAB — BASIC METABOLIC PANEL
Anion gap: 11 (ref 5–15)
BUN: 37 mg/dL — AB (ref 6–23)
CHLORIDE: 114 meq/L — AB (ref 96–112)
CO2: 16 mEq/L — ABNORMAL LOW (ref 19–32)
CREATININE: 1.98 mg/dL — AB (ref 0.50–1.35)
Calcium: 7.8 mg/dL — ABNORMAL LOW (ref 8.4–10.5)
GFR calc Af Amer: 32 mL/min — ABNORMAL LOW (ref >90)
GFR calc non Af Amer: 28 mL/min — ABNORMAL LOW (ref >90)
Glucose, Bld: 106 mg/dL — ABNORMAL HIGH (ref 70–99)
Potassium: 4.5 mEq/L (ref 3.7–5.3)
Sodium: 141 mEq/L (ref 137–147)

## 2014-09-15 LAB — CBC
HEMATOCRIT: 25 % — AB (ref 39.0–52.0)
HEMOGLOBIN: 8.1 g/dL — AB (ref 13.0–17.0)
MCH: 30.2 pg (ref 26.0–34.0)
MCHC: 32.4 g/dL (ref 30.0–36.0)
MCV: 93.3 fL (ref 78.0–100.0)
Platelets: 155 10*3/uL (ref 150–400)
RBC: 2.68 MIL/uL — ABNORMAL LOW (ref 4.22–5.81)
RDW: 13.7 % (ref 11.5–15.5)
WBC: 11.4 10*3/uL — ABNORMAL HIGH (ref 4.0–10.5)

## 2014-09-15 LAB — PROTIME-INR
INR: 2.64 — ABNORMAL HIGH (ref 0.00–1.49)
Prothrombin Time: 28.4 seconds — ABNORMAL HIGH (ref 11.6–15.2)

## 2014-09-15 MED ORDER — CEFUROXIME AXETIL 250 MG PO TABS: 250.0000 mg | ORAL_TABLET | Freq: Two times a day (BID) | ORAL | Status: DC

## 2014-09-15 NOTE — Discharge Summary (Signed)
Physician Discharge Summary  Terry [name] JGO:[id number] DOB: 1921/05/04 DOA: 09/12/2014  PCP: Gennette Pac, MD  Admit date: 09/12/2014 Discharge date: 09/15/2014  Time spent: 35 minutes  Recommendations for Outpatient Follow-up:  1. Follow up with PCP as needed  Discharge Diagnoses:  Active Problems:   HTN (hypertension)   Atrial fibrillation   Bladder cancer   Hematuria   UTI (lower urinary tract infection)   AKI (acute kidney injury)   Hyperkalemia   Discharge Condition: Stable  Diet recommendation: Regular  Filed Weights   09/12/14 2022  Weight: 60.782 kg (134 lb)    History of present illness:  Please see admit h and p from 10/26 for details. Briefly, pt presents with generalized weakness, found to have hematuria with supertherapeutic INR while on coumadin. Patient was admitted for further work up.  Hospital Course:  - AKI on CKD - baseline upper 2s - gentle IVFs. Today down to 1.98. Concerns for gradually worsening renal function down the road in light of pt's hx of bladder cancer involvement. Palliative care was consulted   - Hematuria- If most likely normal after surgery, urology consult called by ER PA, monitor Hgb. Stopped anticoagulatnts. Hematuria improving while off anticoagulants  - Acute blood loss anemia- Hgb down to 7.6 on 10/28, up to over 8 on 10/29 without transfusion. Remain off coumadin per Palliative Care recs   - UTI- On empiric rocephin, urine culture pending   - H/o bladder cancer- urology consulted. Overall poor prognosis with recs for palliative care consult. Consulted. With recs for stopping coumadin.  - Atrial fib- was given large dose of vit K- INR trended down with value of 2.64 on day of discharge- rate low only on clonidine (Dr. Marlou Porch is his cardiologist). Stopped anticoagulation secondary to above   - Weakness- PT/OT eval- due to UTI/hematuria   - Hyperkalemia- monitor- runs high in past,  IVF  Consultations:  Palliative Care  Urology  Discharge Exam: Filed Vitals:   09/14/14 0548 09/14/14 1431 09/14/14 2042 09/15/14 0523  BP: 136/46 129/48 146/48 148/58  Pulse: 95 59 62 59  Temp: 98.2 F (36.8 C) 98.3 F (36.8 C) 98.3 F (36.8 C) 98.3 F (36.8 C)  TempSrc: Oral Oral Oral Oral  Resp: 19 16 18 18   Height:      Weight:      SpO2: 98% 98% 98% 99%    General: Awake, in nad Cardiovascular: regular,s 1, s2 Respiratory: normal resp effort, no wheezing  Discharge Instructions     Medication List    STOP taking these medications       ciprofloxacin 500 MG tablet  Commonly known as:  CIPRO     oxybutynin 5 MG tablet  Commonly known as:  DITROPAN     warfarin 2 MG tablet  Commonly known as:  COUMADIN      TAKE these medications       allopurinol 300 MG tablet  Commonly known as:  ZYLOPRIM  Take 300 mg by mouth every morning. For gout     cefUROXime 250 MG tablet  Commonly known as:  CEFTIN  Take 1 tablet (250 mg total) by mouth 2 (two) times daily with a meal.     cloNIDine 0.1 MG tablet  Commonly known as:  CATAPRES  Take 0.1-0.2 mg by mouth 2 (two) times daily. 2 in the morning and 2 at night     ICAPS PO  Take 1 capsule by mouth 2 (two) times daily.  phenazopyridine 200 MG tablet  Commonly known as:  PYRIDIUM  Take 1 tablet (200 mg total) by mouth 3 (three) times daily as needed for pain.     REFRESH OP  Apply 1 drop to eye 3 (three) times daily as needed (dry eyes).     traMADol-acetaminophen 37.5-325 MG per tablet  Commonly known as:  ULTRACET  Take 1 tablet by mouth every 6 (six) hours as needed.       No Known Allergies Follow-up Information   Follow up with Hospice at [facility].   Specialty:  Hospice and Palliative Medicine   Contact information:   [place] [address] [phone]       Follow up with Gennette Pac, MD. (As needed)    Specialty:  Family Medicine   Contact information:    [place] [address] [phone]        The results of significant diagnostics from this hospitalization (including imaging, microbiology, ancillary and laboratory) are listed below for reference.    Significant Diagnostic Studies: No results found.  Microbiology: Recent Results (from the past 240 hour(s))  URINE CULTURE     Status: None   Collection Time    09/12/14  7:15 PM      Result Value Ref Range Status   Specimen Description URINE, CLEAN CATCH   Final   Special Requests NONE   Final   Culture  Setup Time     Final   Value: 09/13/2014 00:36     Performed at [facility]     Final   Value: 25,000 COLONIES/ML     Performed at [facility]   Culture     Final   Value: Multiple bacterial morphotypes present, none predominant. Suggest appropriate recollection if clinically indicated.     Performed at [facility]   Report Status 09/14/2014 FINAL   Final     Labs: Basic Metabolic Panel:  Recent Labs Lab 09/12/14 1739 09/13/14 0348 09/14/14 0344 09/15/14 0345  NA 144 143 146 141  K 5.4* 4.9 4.6 4.5  CL 114* 115* 118* 114*  CO2 17* 16* 16* 16*  GLUCOSE 118* 133* 101* 106*  BUN 77* 65* 45* 37*  CREATININE 3.51* 2.94* 2.09* 1.98*  CALCIUM 8.9 8.3* 7.8* 7.8*   Liver Function Tests:  Recent Labs Lab 09/12/14 1739  AST 13  ALT <5  ALKPHOS 94  BILITOT 0.5  PROT 6.5  ALBUMIN 2.7*   No results found for this basename: LIPASE, AMYLASE,  in the last 168 hours No results found for this basename: AMMONIA,  in the last 168 hours CBC:  Recent Labs Lab 09/12/14 1739 09/13/14 0348 09/14/14 0344 09/15/14 0345  WBC 7.9 8.4 8.0 11.4*  NEUTROABS 6.4  --   --   --   HGB 9.5* 8.4* 7.6* 8.1*  HCT 28.7* 26.1* 23.3* 25.0*  MCV 89.4 90.9 89.3 93.3  PLT 265 160 154 155   Cardiac Enzymes: No results found for this basename: CKTOTAL, CKMB, CKMBINDEX, TROPONINI,  in the last 168 hours BNP: BNP (last 3  results) No results found for this basename: PROBNP,  in the last 8760 hours CBG: No results found for this basename: GLUCAP,  in the last 168 hours  Signed:  Jehu Mccauslin K  [facility] 09/15/2014, 10:33 AM

## 2014-09-15 NOTE — Progress Notes (Signed)
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 Pt has clear urine, and is happy with condom catheter. He is ready for d/c with Hospice care. He will use condom cath during daytime, and a pad for incontinence at night.   Subjective:     No acute urologic events overnight. Ambulation:   negative Flatus:    positive Bowel movement  positive  Pain: complete resolution  Objective:  Blood pressure 148/58, pulse 59, temperature 98.3 F (36.8 C), temperature source Oral, resp. rate 18, height 5\' 10"  (1.778 m), weight 60.782 kg (134 lb), SpO2 99.00%.  Physical Exam:  General:  No acute distress, awake Extremities: extremities normal, atraumatic, no cyanosis or edema Genitourinary:  Normal penis.  Foley:  Condom cath    I/O last 3 completed shifts: In: 3665 [P.O.:900; I.V.:2715; IV Piggyback:50] Out: 4599 [Urine:1825]  Recent Labs     09/14/14  0344  09/15/14  0345  HGB  7.6*  8.1*  WBC  8.0  11.4*  PLT  154  155    Recent Labs     09/14/14  0344  09/15/14  0345  NA  146  141  K  4.6  4.5  CL  118*  114*  CO2  16*  16*  BUN  45*  37*  CREATININE  2.09*  1.98*  CALCIUM  7.8*  7.8*  GFRNONAA  26*  28*  GFRAA  30*  32*     Recent Labs     09/13/14  0348  09/15/14  0345  INR  4.79*  2.64*     No components found with this basename: ABG,   Assessment/Plan:  Wound care discussed. Hospice at home. Of coumadin.  RTC prn.

## 2014-09-15 NOTE — Progress Notes (Signed)
Pt discharged home with family, discharge instructions reviewed with pt and family all questions answered. Pt verbalized understanding. [facility] contacted and aware of discharge

## 2014-09-16 LAB — URINE CULTURE
Colony Count: NO GROWTH
Culture: NO GROWTH

## 2014-10-14 ENCOUNTER — Ambulatory Visit (INDEPENDENT_AMBULATORY_CARE_PROVIDER_SITE_OTHER): Payer: Medicare Other | Admitting: Cardiology

## 2014-10-14 ENCOUNTER — Encounter: Payer: Self-pay | Admitting: Cardiology

## 2014-10-14 VITALS — BP 122/58 | HR 58 | Ht 70.0 in | Wt 147.0 lb

## 2014-10-14 DIAGNOSIS — C679 Malignant neoplasm of bladder, unspecified: Secondary | ICD-10-CM

## 2014-10-14 DIAGNOSIS — I482 Chronic atrial fibrillation, unspecified: Secondary | ICD-10-CM

## 2014-10-14 DIAGNOSIS — I10 Essential (primary) hypertension: Secondary | ICD-10-CM

## 2014-10-14 NOTE — Progress Notes (Signed)
[facility]. [address], [address], [place]  [address] Phone: [phone] Fax:  [phone]  Date:  10/14/2014   ID:  Terry [name], DOB October 18, 1921, MRN [id number]  PCP:  Gennette Pac, MD   History of Present Illness: Terry [name] is a 80 y.o. male with chronic atrial fibrillation, chronic anticoagulation, metastatic prostate/bladder cancer here for followup. Had cystoscopy with bladder tumor removal. Kidney function worsened, creatinine 3.2.   Previously had gross hematuria. Stopped his warfarin, his hematuria resolved.   Denies  syncope, denies any shortness of breath, no chest pain.  Main complaint is weakness of legs. Posthospitalization on 09/15/14. Hospital reviewed   Wt Readings from Last 3 Encounters:  10/14/14 147 lb (66.679 kg)  09/12/14 134 lb (60.782 kg)  09/08/14 147 lb (66.679 kg)     Past Medical History  Diagnosis Date  . Hypertension   . History of BPH     reported by patient  . Chronic anticoagulation   . PAF (paroxysmal atrial fibrillation)   . Coronary artery disease CARDIOLOGIST- DR Marlou Porch LAST VISIT 2 MON AGO-- WILL REQUEST NOTE, STRESS TEST AND ECHO  . History of gout STABLE  . Arthritis   . Nocturia   . Frequency of urination   . Osteoarthrosis, unspecified whether generalized or localized, shoulder region   . History of hepatitis 1965    no residual problems  . Diverticulosis   . Hematuria     "occasional pure blood-lab says some blood always"  . Prostate cancer   . History of bladder cancer 02-10-14    surgery planned  . Heart murmur   . RBBB (right bundle branch block)   . Other specified cardiac dysrhythmias(427.89)   . Asthma     AS CHILD ONLY-from dog and cat allergy  . Urinary incontinence     has condom catheter  . Hepatitis     hepatitis- hx of "from fish in [place]"  . Weight loss     at least 50 lbs in 2 years  . Dry eyes     Past Surgical History  Procedure Laterality Date  . Cataract extraction  w/ intraocular lens  implant, bilateral    . Cardiovascular stress test  09-10-2007--  PER DR SKAINS NOTE    LOW RISK/ NO ISCHEMIA  . Transthoracic echocardiogram  11-08-2008    EF 65-70%/  NORMAL LVSF/ MILD AORTIC STENOSIS/ MOD. LEFT ATRIAL ENLARGEMENT  . Cardioversion  X2   2001  . Cryoablation  01/24/2012    Procedure: CRYO ABLATION PROSTATE;  Surgeon: Ailene Rud, MD;  Location: [facility];  Service: Urology;  Laterality: N/A;  . Transurethral resection of prostate      "FROZE PART OF THE PROSTATE"  . Transurethral resection of bladder tumor  12/24/2012    Procedure: TRANSURETHRAL RESECTION OF BLADDER TUMOR (TURBT);  Surgeon: Ailene Rud, MD;  Location: [facility];  Service: Urology;  Laterality: N/A;  . Tonsillectomy      child  . Transurethral resection of bladder tumor N/A 04/08/2014    Procedure: TRANSURETHRAL RESECTION OF BLADDER TUMOR (TURBT);  Surgeon: Ailene Rud, MD;  Location: [facility];  Service: Urology;  Laterality: N/A;  . Transurethral resection of bladder tumor with gyrus (turbt-gyrus) N/A 09/05/2014    Procedure: CYSTOSCOPY WITH TRANSURETHRAL RESECTION OF BLADDER TUMOR WITH GYRUS (TURBT-GYRUS);  Surgeon: Ailene Rud, MD;  Location: [facility];  Service: Urology;  Laterality: N/A;    Current Outpatient  Prescriptions  Medication Sig Dispense Refill  . allopurinol (ZYLOPRIM) 300 MG tablet Take 300 mg by mouth every morning. For gout    . cloNIDine (CATAPRES) 0.1 MG tablet Take 0.1-0.2 mg by mouth 2 (two) times daily. 2 in the morning and 2 at night    . Multiple Vitamins-Minerals (ICAPS PO) Take 1 capsule by mouth 2 (two) times daily.     . phenazopyridine (PYRIDIUM) 200 MG tablet Take 1 tablet (200 mg total) by mouth 3 (three) times daily as needed for pain. 30 tablet 3  . Polyvinyl Alcohol-Povidone (REFRESH OP) Apply 1 drop to eye 3 (three) times daily as needed (dry eyes).     . traMADol-acetaminophen (ULTRACET) 37.5-325 MG per  tablet Take 1 tablet by mouth every 6 (six) hours as needed. 30 tablet 2   No current facility-administered medications for this visit.    Allergies:    No Known Allergies  Social History:  The patient  reports that he quit smoking about 63 years ago. His smoking use included Cigarettes. He smoked 0.00 packs per day. His smokeless tobacco use includes Snuff. He reports that he drinks alcohol. He reports that he does not use illicit drugs.   ROS: Chest pain, no falls, no syncope, no bleeding Please see the history of present illness.       PHYSICAL EXAM: VS:  BP 122/58 mmHg  Pulse 58  Ht 5\' 10"  (1.778 m)  Wt 147 lb (66.679 kg)  BMI 21.09 kg/m2 Well nourished, well developed, in no acute distress, mildly disheveled HEENT: normal, lengthy eyebrows. Neck: no JVD Cardiac:  Bradycardic, irregular, heart rate currently in the 50; no murmur Lungs:  clear to auscultation bilaterally, no wheezing, rhonchi or rales Abd: soft, nontender, no hepatomegaly Ext: 1+ ble edema Skin: warm and dry Neuro: no focal abnormalities noted  EKG: 09/08/14-atrial fibrillation heart rate 60, right bundle branch block, old inferior infarct pattern, PVC. Since/tracing, heart rate increased.  Labs: Creatinine ranging from 2.2-3.2 during hospitalization in late   ASSESSMENT AND PLAN:  1. Persistent atrial fibrillation-bradycardia noted. He's not having any symptoms from his bradycardia. Pacemaker may be warranted if syncope occurs. No AV nodal blocking agents. Stable. No longer on Coumadin. Bleeding episode. 2. Hypertension-he states that he has worked hard on keeping this under control. With his recent acute rise in creatinine I would hold his losartan and his HCTZ. I am okay with him continuing his clonidine. 3. Bradycardia-  no symptoms. As above.  4. Bladder cancer-cystoscopy x 3, Dr. Gaynelle Arabian   See back in 3 months.   Signed, Candee Furbish, MD [facility]  10/14/2014 3:41 PM

## 2014-10-14 NOTE — Patient Instructions (Signed)
The current medical regimen is effective;  continue present plan and medications.  Follow up in 3 months with Dr Marlou Porch.

## 2014-10-20 IMAGING — CR DG CHEST 2V
2 series · 2 of 2 positions shown · non-contrast
Comparison: Two-view chest 02/10/2014.

CLINICAL DATA: hx of atrial fib and hypertension preop TURBT

EXAM:
CHEST  2 VIEW

[w chest pa]
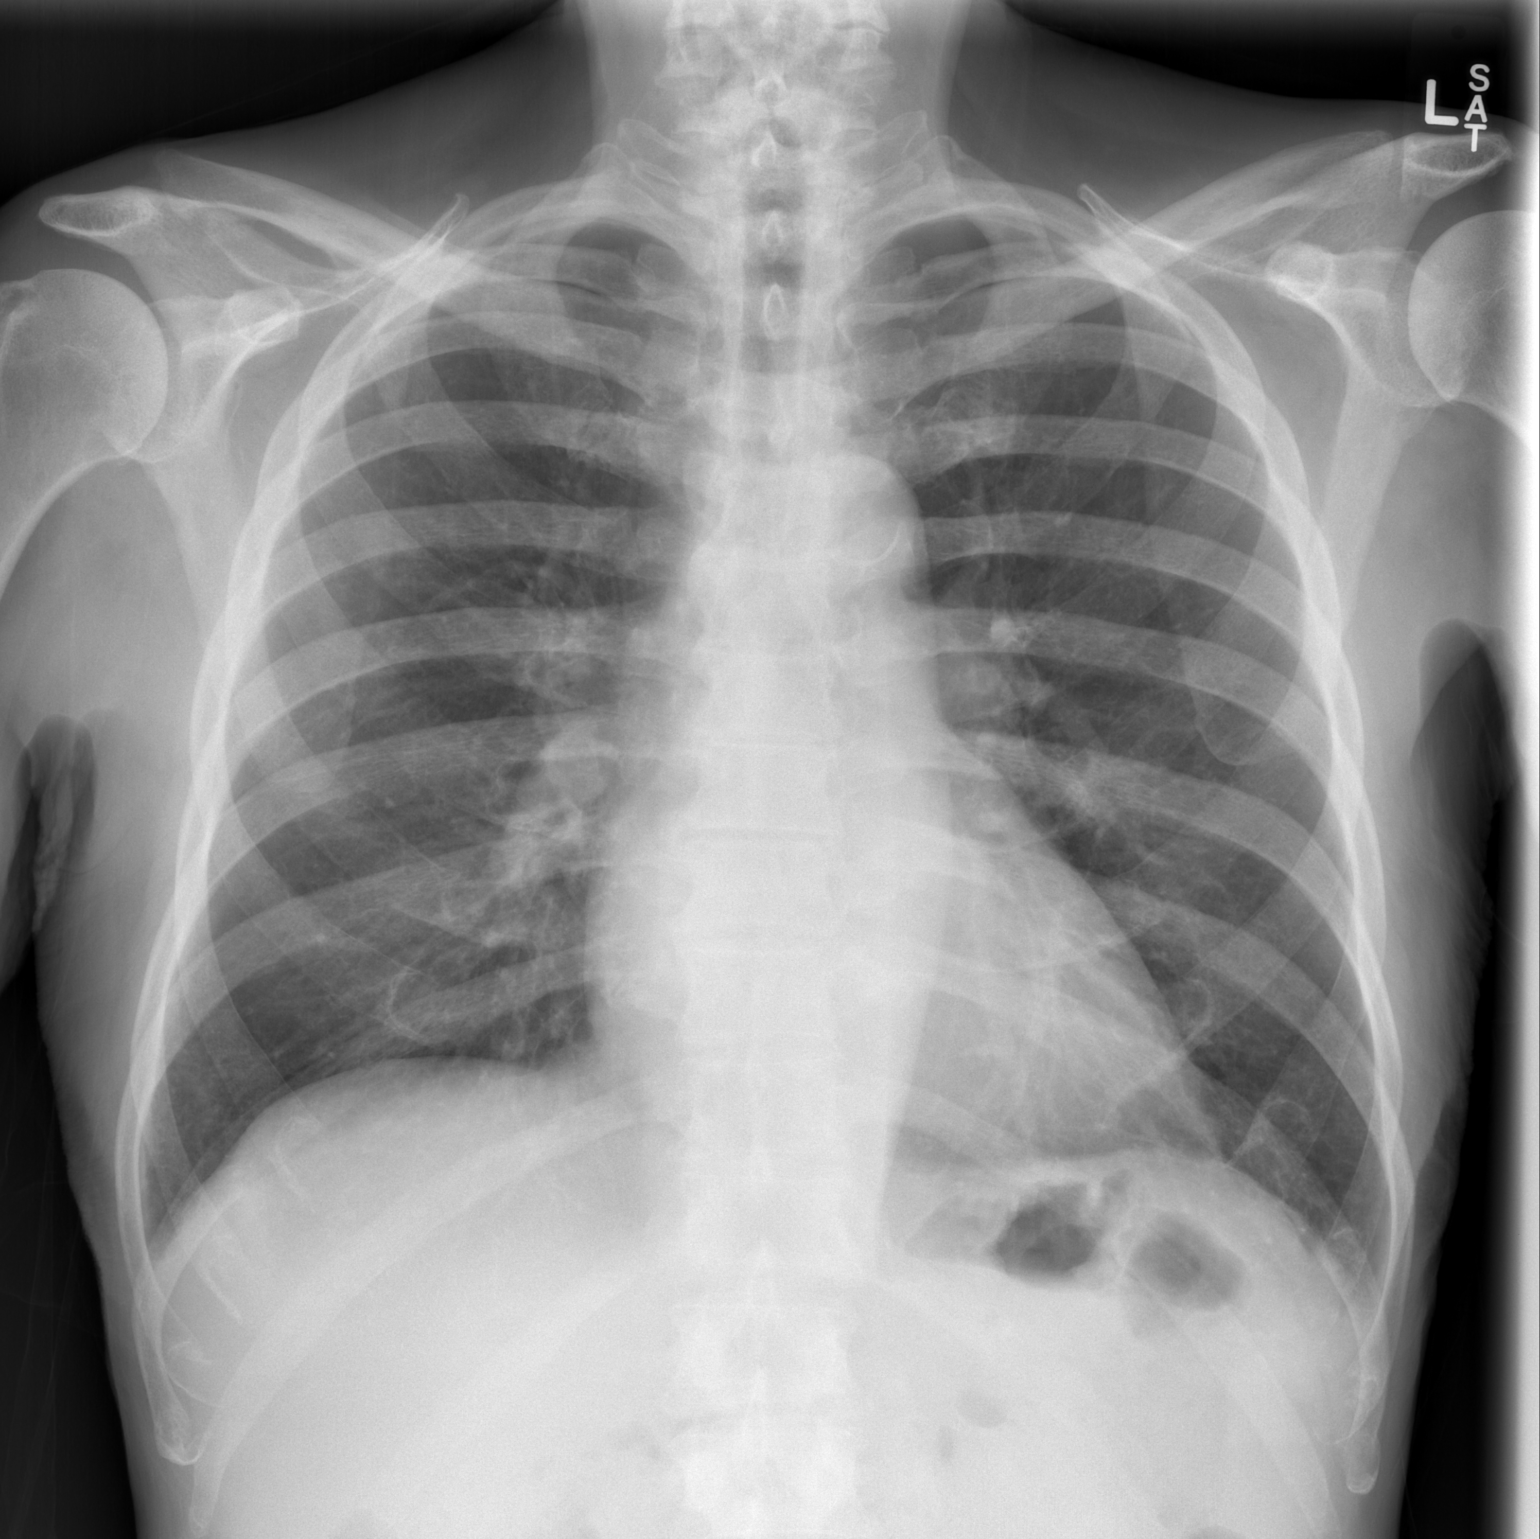

[w chest lat]
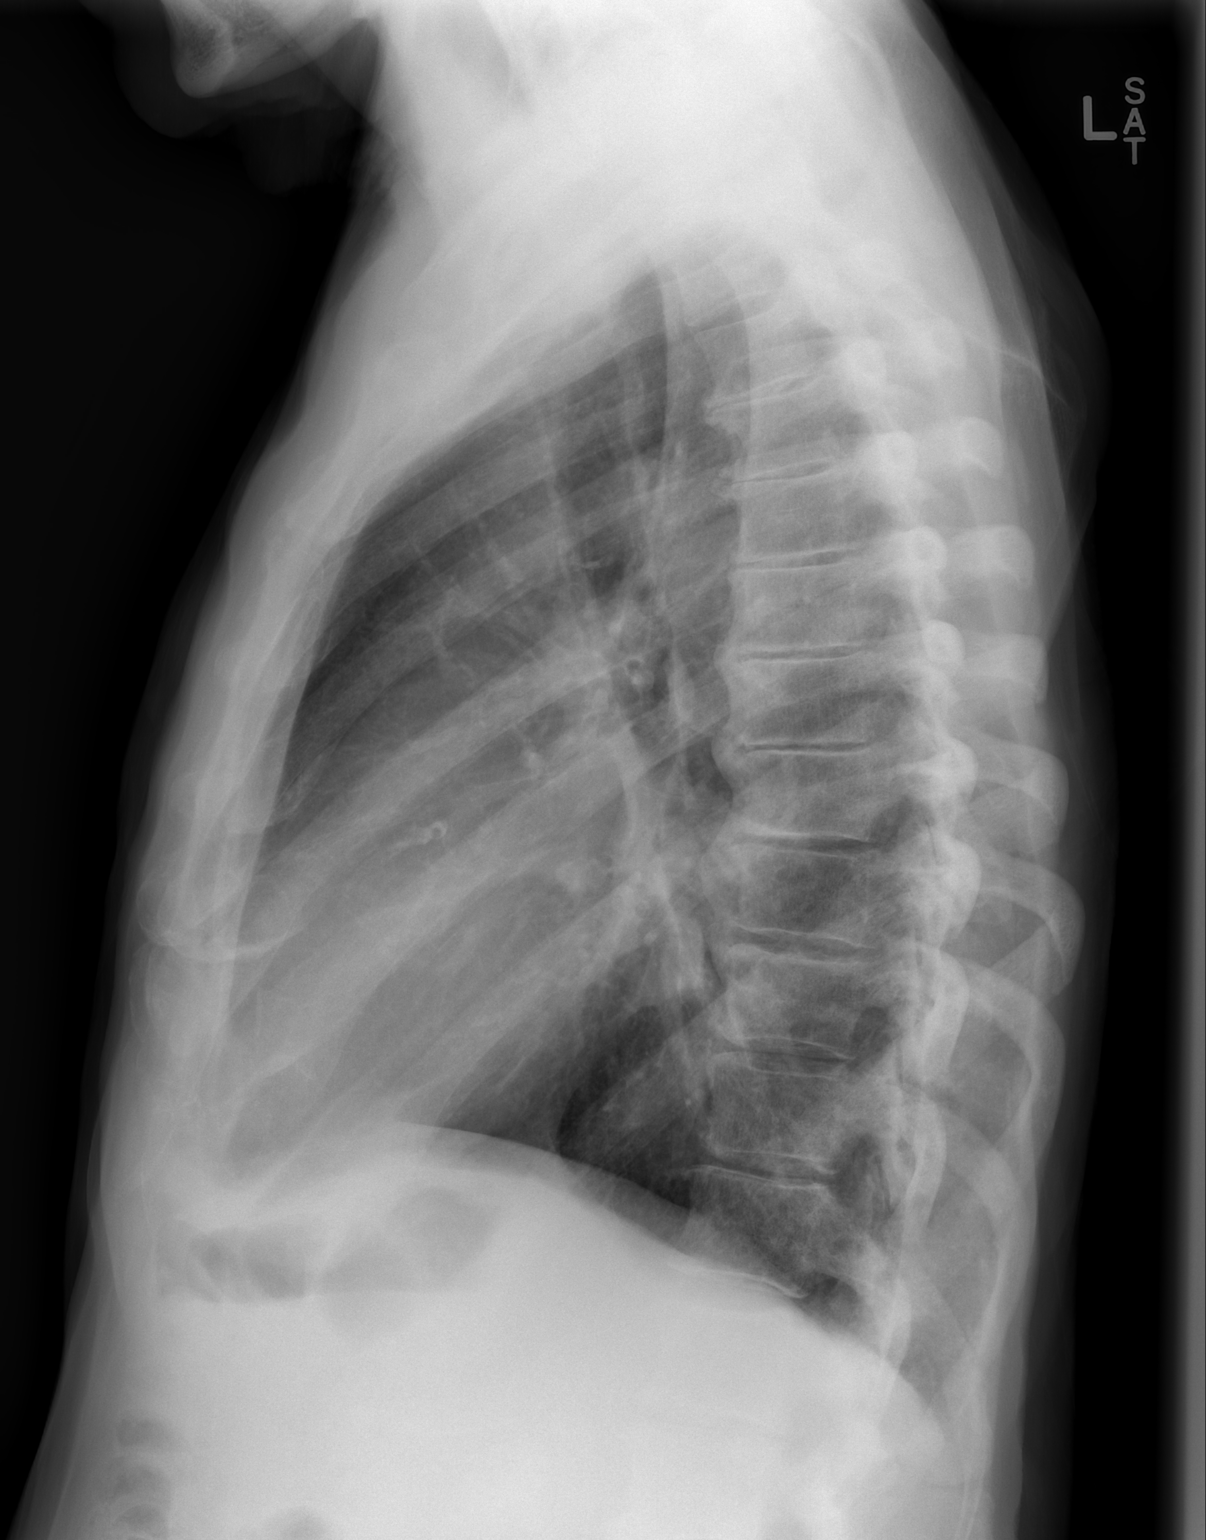

[2 of 2 positions shown; findings below may reference images not displayed]

FINDINGS: The heart size and mediastinal contours are within normal limits.
Both lungs are clear. The visualized skeletal structures are
unremarkable.
IMPRESSION: No active cardiopulmonary disease.

## 2014-11-24 ENCOUNTER — Other Ambulatory Visit (HOSPITAL_BASED_OUTPATIENT_CLINIC_OR_DEPARTMENT_OTHER)

## 2014-11-24 ENCOUNTER — Telehealth: Payer: Self-pay | Admitting: Oncology

## 2014-11-24 ENCOUNTER — Ambulatory Visit (HOSPITAL_BASED_OUTPATIENT_CLINIC_OR_DEPARTMENT_OTHER): Payer: Medicare Other | Admitting: Oncology

## 2014-11-24 VITALS — BP 114/32 | HR 60 | Resp 18 | Ht 70.0 in | Wt 141.8 lb

## 2014-11-24 DIAGNOSIS — C679 Malignant neoplasm of bladder, unspecified: Secondary | ICD-10-CM

## 2014-11-24 DIAGNOSIS — Z8546 Personal history of malignant neoplasm of prostate: Secondary | ICD-10-CM | POA: Diagnosis not present

## 2014-11-24 DIAGNOSIS — C7951 Secondary malignant neoplasm of bone: Secondary | ICD-10-CM | POA: Diagnosis not present

## 2014-11-24 LAB — COMPREHENSIVE METABOLIC PANEL (CC13)
ALT: 15 U/L (ref 0–55)
AST: 25 U/L (ref 5–34)
Albumin: 3.1 g/dL — ABNORMAL LOW (ref 3.5–5.0)
Alkaline Phosphatase: 135 U/L (ref 40–150)
Anion Gap: 6 mEq/L (ref 3–11)
BUN: 25.2 mg/dL (ref 7.0–26.0)
CO2: 20 mEq/L — ABNORMAL LOW (ref 22–29)
Calcium: 9 mg/dL (ref 8.4–10.4)
Chloride: 114 mEq/L — ABNORMAL HIGH (ref 98–109)
Creatinine: 1.4 mg/dL — ABNORMAL HIGH (ref 0.7–1.3)
EGFR: 45 mL/min/{1.73_m2} — AB (ref >90)
GLUCOSE: 109 mg/dL (ref 70–140)
POTASSIUM: 4.4 meq/L (ref 3.5–5.1)
Sodium: 140 mEq/L (ref 136–145)
TOTAL PROTEIN: 5.4 g/dL — AB (ref 6.4–8.3)
Total Bilirubin: 0.59 mg/dL (ref 0.20–1.20)

## 2014-11-24 LAB — CBC WITH DIFFERENTIAL/PLATELET
BASO%: 0.6 % (ref 0.0–2.0)
Basophils Absolute: 0 10*3/uL (ref 0.0–0.1)
EOS%: 2.4 % (ref 0.0–7.0)
Eosinophils Absolute: 0.2 10*3/uL (ref 0.0–0.5)
HCT: 30.5 % — ABNORMAL LOW (ref 38.4–49.9)
HGB: 9.5 g/dL — ABNORMAL LOW (ref 13.0–17.1)
LYMPH%: 18.1 % (ref 14.0–49.0)
MCH: 28.5 pg (ref 27.2–33.4)
MCHC: 31.3 g/dL — AB (ref 32.0–36.0)
MCV: 91 fL (ref 79.3–98.0)
MONO#: 0.8 10*3/uL (ref 0.1–0.9)
MONO%: 9.8 % (ref 0.0–14.0)
NEUT#: 5.5 10*3/uL (ref 1.5–6.5)
NEUT%: 69.1 % (ref 39.0–75.0)
Platelets: 322 10*3/uL (ref 140–400)
RBC: 3.35 10*6/uL — ABNORMAL LOW (ref 4.20–5.82)
RDW: 14.6 % (ref 11.0–14.6)
WBC: 8 10*3/uL (ref 4.0–10.3)
lymph#: 1.5 10*3/uL (ref 0.9–3.3)

## 2014-11-24 NOTE — Telephone Encounter (Signed)
gv adn printed appt sched and avs for pt for April  °

## 2014-11-24 NOTE — Progress Notes (Signed)
Hematology and Oncology Follow Up Visit  Terry [name] [id number] 09-Sep-1921 81 y.o. 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 A PET CT scan showed retroperitoneal, pelvic adenopathy and possible L4 bone lesion.   Prior Therapy: He is status post TURBT on February of 2014 with the pathology showing well-differentiated adenocarcinoma tumor involving the muscularis propria.  Current therapy: Supportive care only he declined chemotherapy in the past.   Interim History:  Terry [name] presents today for a followup visit with his sons. Since his last visit, he continues to have functional decline. He has hospice involved at this time and his mobility have decreased. He is reporting intermittent hematuria at this time and it's not persistent. It is reporting incontinence and have a condom catheter in place. He is not reporting any pelvic pain or difficulty urination. He is not reporting any flank pain or discomfort. He Is not reporting any abdominal pain. Is not reporting any back pain or deterioration in his performance status or quality of life. He has not reported any headaches or blurry vision or double vision or syncope. He did report occasional unsteadiness but no falls. Did not report any chest pain or shortness of breath. Does not report any nausea or vomiting. Does not report any musculoskeletal complaints. Rest of his review of systems was unremarkable.  Medications: I have reviewed the patient's current medications.  Current Outpatient Prescriptions  Medication Sig Dispense Refill  . allopurinol (ZYLOPRIM) 300 MG tablet Take 300 mg by mouth every morning. For gout    . cloNIDine (CATAPRES) 0.1 MG tablet Take 0.1-0.2 mg by mouth 2 (two) times daily. 2 in the morning and 2 at night    . Multiple Vitamins-Minerals (ICAPS PO) Take 1 capsule by mouth 2 (two) times daily.     . phenazopyridine  (PYRIDIUM) 200 MG tablet Take 1 tablet (200 mg total) by mouth 3 (three) times daily as needed for pain. 30 tablet 3  . Polyvinyl Alcohol-Povidone (REFRESH OP) Apply 1 drop to eye 3 (three) times daily as needed (dry eyes).     . traMADol-acetaminophen (ULTRACET) 37.5-325 MG per tablet Take 1 tablet by mouth every 6 (six) hours as needed. 30 tablet 2   No current facility-administered medications for this visit.     Allergies:  No Known Allergies  Physical Exam: Blood pressure 114/32, pulse 60, temperature 0 F (-17.8 C), resp. rate 18, height 5\' 10"  (1.778 m), weight 141 lb 12.8 oz (64.32 kg), SpO2 96 %. ECOG: 2 General appearance: alert awake not in any distress. Head: Normocephalic, without obvious abnormality, atraumatic Neck: no adenopathy Lymph nodes: Cervical, supraclavicular, and axillary nodes normal. Heart:regular rate and rhythm, S1, S2 normal, no murmur, click, rub or gallop Lung:chest clear, no wheezing, rales, normal symmetric air entry Abdomin: soft, non-tender, without masses or organomegaly EXT:no erythema, induration, or nodules   Lab Results: Lab Results  Component Value Date   WBC 8.0 11/24/2014   HGB 9.5* 11/24/2014   HCT 30.5* 11/24/2014   MCV 91.0 11/24/2014   PLT 322 11/24/2014     Chemistry      Component Value Date/Time   NA 141 09/15/2014 0345   NA 142 08/24/2014 0851   K 4.5 09/15/2014 0345   K 5.8* 08/24/2014 0851   CL 114* 09/15/2014 0345   CL 114* 05/04/2013 0858   CO2 16* 09/15/2014 0345   CO2 19* 08/24/2014 0851   BUN  37* 09/15/2014 0345   BUN 44.8* 08/24/2014 0851   CREATININE 1.98* 09/15/2014 0345   CREATININE 2.2* 08/24/2014 0851      Component Value Date/Time   CALCIUM 7.8* 09/15/2014 0345   CALCIUM 9.5 08/24/2014 0851   ALKPHOS 94 09/12/2014 1739   ALKPHOS 115 08/24/2014 0851   AST 13 09/12/2014 1739   AST 17 08/24/2014 0851   ALT <5 09/12/2014 1739   ALT 10 08/24/2014 0851   BILITOT 0.5 09/12/2014 1739   BILITOT 0.51  08/24/2014 0851       Impression and Plan:  81 year old gentleman with the following issues:  1. Stage IV adenocarcinoma of the bladder:  He is continued to refuse systemic chemotherapy for the time being. He is minimally symptomatic at this time although he has overall functional decline. I see no role for radiation therapy at this time and we'll continue with supportive management. We can consider radiation therapy if he develops pelvic pain or persistent hematuria. He understand that he has an incurable disease and will likely continue to decline functionally. He understands that he has limited life expectancy likely in the single digit months.  2. Bony disease: He has very limited disease at L4 and he is asymptomatic from it. He develops back pain will obtain an MRI and address that area.  3. Hematuria: This has subsided. We can consider palliative radiation therapy if needed to. His hemoglobin appeared stable at this time.  4. History of prostate cancer: Diagnosed in 2013 Gleason score 3+3 equals 6 he is status post cryoablation his PSA is under control.  5. Followup: Will be in 3 months.  FMMCRF,VOHKG, MD 1/7/201610:55 AM

## 2014-12-01 ENCOUNTER — Encounter (HOSPITAL_BASED_OUTPATIENT_CLINIC_OR_DEPARTMENT_OTHER): Payer: Self-pay | Admitting: Urology

## 2015-01-19 ENCOUNTER — Encounter: Payer: Self-pay | Admitting: Cardiology

## 2015-01-19 ENCOUNTER — Ambulatory Visit (INDEPENDENT_AMBULATORY_CARE_PROVIDER_SITE_OTHER): Admitting: Cardiology

## 2015-01-19 VITALS — BP 118/48 | HR 48 | Ht 70.0 in | Wt 137.0 lb

## 2015-01-19 DIAGNOSIS — I4819 Other persistent atrial fibrillation: Secondary | ICD-10-CM

## 2015-01-19 DIAGNOSIS — I10 Essential (primary) hypertension: Secondary | ICD-10-CM

## 2015-01-19 DIAGNOSIS — I481 Persistent atrial fibrillation: Secondary | ICD-10-CM

## 2015-01-19 DIAGNOSIS — C675 Malignant neoplasm of bladder neck: Secondary | ICD-10-CM

## 2015-01-19 DIAGNOSIS — E875 Hyperkalemia: Secondary | ICD-10-CM

## 2015-01-19 DIAGNOSIS — R001 Bradycardia, unspecified: Secondary | ICD-10-CM

## 2015-01-19 NOTE — Progress Notes (Signed)
[facility]. [address], [address] [place], [place]  [address] Phone: [phone] Fax:  [phone]  Date:  01/19/2015   ID:  Terry [name], DOB [birth year], MRN [id number]  PCP:  Gennette Pac, MD   History of Present Illness: Terry [name] is a 81 y.o. male with chronic atrial fibrillation, chronic anticoagulation, metastatic prostate/bladder cancer here for followup. Had cystoscopy with bladder tumor removal. Kidney function worsened, creatinine 3.2.   Previously had gross hematuria. Stopped his warfarin, his hematuria resolved.   Constipated, abdominal cramps. Hydrocodone. Only taken it for 3 nights. Used to wake up 4-6 times a night to go to bathroom.   Denies  syncope, denies any shortness of breath, no chest pain.     Wt Readings from Last 3 Encounters:  01/19/15 137 lb (62.143 kg)  11/24/14 141 lb 12.8 oz (64.32 kg)  10/14/14 147 lb (66.679 kg)     Past Medical History  Diagnosis Date  . Hypertension   . History of BPH     reported by patient  . Chronic anticoagulation   . PAF (paroxysmal atrial fibrillation)   . Coronary artery disease CARDIOLOGIST- DR Marlou Porch LAST VISIT 2 MON AGO-- WILL REQUEST NOTE, STRESS TEST AND ECHO  . History of gout STABLE  . Arthritis   . Nocturia   . Frequency of urination   . Osteoarthrosis, unspecified whether generalized or localized, shoulder region   . History of hepatitis 1965    no residual problems  . Diverticulosis   . Hematuria     "occasional pure blood-lab says some blood always"  . Prostate cancer   . History of bladder cancer 02-10-14    surgery planned  . Heart murmur   . RBBB (right bundle branch block)   . Other specified cardiac dysrhythmias(427.89)   . Asthma     AS CHILD ONLY-from dog and cat allergy  . Urinary incontinence     has condom catheter  . Hepatitis     hepatitis- hx of "from fish in [place]"  . Weight loss     at least 50 lbs in 2 years  . Dry eyes     Past Surgical History    Procedure Laterality Date  . Cataract extraction w/ intraocular lens  implant, bilateral    . Cardiovascular stress test  09-10-2007--  PER DR SKAINS NOTE    LOW RISK/ NO ISCHEMIA  . Transthoracic echocardiogram  11-08-2008    EF 65-70%/  NORMAL LVSF/ MILD AORTIC STENOSIS/ MOD. LEFT ATRIAL ENLARGEMENT  . Cardioversion  X2   2001  . Cryoablation  01/24/2012    Procedure: CRYO ABLATION PROSTATE;  Surgeon: Ailene Rud, MD;  Location: [facility];  Service: Urology;  Laterality: N/A;  . Transurethral resection of prostate      "FROZE PART OF THE PROSTATE"  . Transurethral resection of bladder tumor  12/24/2012    Procedure: TRANSURETHRAL RESECTION OF BLADDER TUMOR (TURBT);  Surgeon: Ailene Rud, MD;  Location: [facility];  Service: Urology;  Laterality: N/A;  . Tonsillectomy      child  . Transurethral resection of bladder tumor N/A 04/08/2014    Procedure: TRANSURETHRAL RESECTION OF BLADDER TUMOR (TURBT);  Surgeon: Ailene Rud, MD;  Location: [facility];  Service: Urology;  Laterality: N/A;  . Transurethral resection of bladder tumor with gyrus (turbt-gyrus) N/A 09/05/2014    Procedure: CYSTOSCOPY WITH TRANSURETHRAL RESECTION OF BLADDER TUMOR WITH GYRUS (TURBT-GYRUS);  Surgeon: Shane Crutch  Gaynelle Arabian, MD;  Location: [facility];  Service: Urology;  Laterality: N/A;    Current Outpatient Prescriptions  Medication Sig Dispense Refill  . allopurinol (ZYLOPRIM) 300 MG tablet Take 300 mg by mouth every morning. For gout    . cloNIDine (CATAPRES) 0.1 MG tablet Take 0.1-0.2 mg by mouth 2 (two) times daily. 2 in the morning and 2 at night    . HYDROcodone-acetaminophen (NORCO) 10-325 MG per tablet Take 1 tablet by mouth every 6 (six) hours as needed.    . Multiple Vitamins-Minerals (ICAPS PO) Take 1 capsule by mouth 2 (two) times daily.     . phenazopyridine (PYRIDIUM) 200 MG tablet Take 1 tablet (200 mg total) by mouth 3 (three) times daily as needed for pain. 30 tablet 3  .  Polyvinyl Alcohol-Povidone (REFRESH OP) Apply 1 drop to eye 3 (three) times daily as needed (dry eyes).     . Ranitidine HCl (ZANTAC PO) Take by mouth.    . traMADol-acetaminophen (ULTRACET) 37.5-325 MG per tablet Take 1 tablet by mouth every 6 (six) hours as needed. 30 tablet 2   No current facility-administered medications for this visit.    Allergies:    No Known Allergies  Social History:  The patient  reports that he quit smoking about 64 years ago. His smoking use included Cigarettes. His smokeless tobacco use includes Snuff. He reports that he drinks alcohol. He reports that he does not use illicit drugs.   ROS: Chest pain, no falls, no syncope, no bleeding Please see the history of present illness.       PHYSICAL EXAM: VS:  BP 118/48 mmHg  Pulse 48  Ht 5\' 10"  (1.778 m)  Wt 137 lb (62.143 kg)  BMI 19.66 kg/m2 Well nourished, well developed, in no acute distress, mildly disheveled HEENT: normal, lengthy eyebrows. Neck: no JVD Cardiac:  Bradycardic, irregular, heart rate currently in the 50; no murmur Lungs:  clear to auscultation bilaterally, no wheezing, rhonchi or rales Abd: soft, nontender, no hepatomegaly Ext: 1+ ble edema Skin: warm and dry Neuro: no focal abnormalities noted  EKG: 09/08/14-atrial fibrillation heart rate 60, right bundle branch block, old inferior infarct pattern, PVC. Since/tracing, heart rate increased.  Labs: Creatinine ranging from 2.2-3.2 during hospitalization in late   ASSESSMENT AND PLAN:  1. Persistent atrial fibrillation-bradycardia noted. He's not having any symptoms from his bradycardia. Pacemaker may be warranted if syncope occurs however he understands that with his decreased-expectancy this may not be a good option. No AV nodal blocking agents. Stable. No longer on Coumadin. Bleeding episode. 2. Hypertension-he states that he has worked hard on keeping this under control. With his recent acute rise in creatinine I have discontinued his  losartan and his HCTZ. I will now stop his clonidine. BP soft.  3. Bradycardia-  no symptoms. As above.  4. Bladder cancer-cystoscopy x 3, Dr. Gaynelle Arabian   See back in 4 months.   Signed, Candee Furbish, MD [facility]  01/19/2015 2:45 PM

## 2015-01-19 NOTE — Patient Instructions (Addendum)
Please stop your Clonidine. Continue all other medications as listed.  Follow up in 4 months with Dr. Marlou Porch.  You will receive a letter in the mail 2 months before you are due.  Please call us when you receive this letter to schedule your follow up appointment.  Thank you for choosing [facility]!!

## 2015-02-07 ENCOUNTER — Ambulatory Visit: Payer: Self-pay | Admitting: Cardiology

## 2015-02-07 DIAGNOSIS — I4891 Unspecified atrial fibrillation: Secondary | ICD-10-CM

## 2015-03-01 ENCOUNTER — Ambulatory Visit: Payer: Medicare Other | Admitting: Oncology

## 2015-03-01 ENCOUNTER — Other Ambulatory Visit: Payer: Medicare Other

## 2015-03-01 ENCOUNTER — Telehealth: Payer: Self-pay | Admitting: Oncology

## 2015-03-01 NOTE — Telephone Encounter (Signed)
Received death certificate 03/01/15 °

## 2015-03-19 DEATH — deceased
